# Patient Record
Sex: Female | Born: 1980 | Race: Black or African American | Hispanic: No | Marital: Married | Smoking: Never smoker
Health system: Southern US, Community
[De-identification: ages and names within clinical notes are randomized; demographics above are authoritative.]

## PROBLEM LIST (undated history)

## (undated) DIAGNOSIS — Z8759 Personal history of other complications of pregnancy, childbirth and the puerperium: Secondary | ICD-10-CM

## (undated) DIAGNOSIS — Z862 Personal history of diseases of the blood and blood-forming organs and certain disorders involving the immune mechanism: Secondary | ICD-10-CM

## (undated) DIAGNOSIS — Z9289 Personal history of other medical treatment: Secondary | ICD-10-CM

## (undated) DIAGNOSIS — I10 Essential (primary) hypertension: Secondary | ICD-10-CM

## (undated) HISTORY — DX: Personal history of other medical treatment: Z92.89

## (undated) HISTORY — DX: Essential (primary) hypertension: I10

## (undated) HISTORY — PX: HERNIA REPAIR: SHX51

## (undated) HISTORY — DX: Personal history of diseases of the blood and blood-forming organs and certain disorders involving the immune mechanism: Z86.2

## (undated) HISTORY — PX: HYSTEROSCOPY: SHX211

## (undated) HISTORY — PX: DILATION AND CURETTAGE OF UTERUS: SHX78

## (undated) HISTORY — DX: Personal history of other complications of pregnancy, childbirth and the puerperium: Z87.59

---

## 1999-03-15 HISTORY — PX: BIOPSY BREAST: PRO8

## 2003-03-15 DIAGNOSIS — Z9289 Personal history of other medical treatment: Secondary | ICD-10-CM

## 2003-03-15 HISTORY — DX: Personal history of other medical treatment: Z92.89

## 2009-04-07 ENCOUNTER — Ambulatory Visit: Payer: Self-pay | Admitting: Family Medicine

## 2009-04-07 DIAGNOSIS — E663 Overweight: Secondary | ICD-10-CM | POA: Insufficient documentation

## 2009-04-07 DIAGNOSIS — D6861 Antiphospholipid syndrome: Secondary | ICD-10-CM

## 2009-04-09 LAB — CONVERTED CEMR LAB
LDL Cholesterol: 69 mg/dL (ref 0–99)
Total CHOL/HDL Ratio: 3
Triglycerides: 46 mg/dL (ref 0.0–149.0)
VLDL: 9.2 mg/dL (ref 0.0–40.0)

## 2009-08-29 ENCOUNTER — Emergency Department (HOSPITAL_COMMUNITY): Admission: EM | Admit: 2009-08-29 | Discharge: 2009-08-29 | Payer: Self-pay | Admitting: Family Medicine

## 2009-08-29 ENCOUNTER — Encounter: Payer: Self-pay | Admitting: Family Medicine

## 2009-08-30 ENCOUNTER — Emergency Department (HOSPITAL_COMMUNITY): Admission: EM | Admit: 2009-08-30 | Discharge: 2009-08-30 | Payer: Self-pay | Admitting: Family Medicine

## 2009-08-31 ENCOUNTER — Telehealth: Payer: Self-pay | Admitting: Family Medicine

## 2009-11-23 ENCOUNTER — Telehealth: Payer: Self-pay | Admitting: Family Medicine

## 2010-04-13 NOTE — Progress Notes (Signed)
Summary: call a nurse  Phone Note Call from Patient   Summary of Call: Triage Record Num: [id number] Operator: Dayton Martes Patient Name: Jillian Fleming Call Date & Time: 08/29/2009 5:26:38PM Patient Phone: [phone] PCP: Audrie Gallus. Tower Patient Gender: Female PCP Fax : Patient DOB: 02/18/1981 Practice Name: [facility] Reason for Call: LMP now Pt sts that she was outside this am and she thinks she got bitten by something but doesn't know what it was. Pt has an area on her arm that is red, swollen and hot and  ~ 4" in diameter. Arm is throbbing and it is white around the puncture mark. Denies 911 sx's. Care adv given. Adv ER now. Protocol(s) Used: Bites and Stings - Insects / Spiders Recommended Outcome per Protocol: See ED Immediately Reason for Outcome: Suspected brown recluse spider bite AND signs of envenomation (burning pain with a white area) Care Advice:  ~ Another adult should drive.  ~ Be careful to avoid injury to the affected areas.  ~ DO NOT give medication to control the pain. Write down provider's name. List or place the following in a bag for transport with the patient: current prescription and/or OTC medications; alternative treatments, therapies and medications; and street drugs.  ~  ~ Do not break blisters, intact blisters are believed to reduce pain and decrease chance of secondary infection.  ~ Apply cloth-covered ice pack or a cool compress to the area while in transit to reduce pain and swelling.  ~ Do not apply anesthetic ointment or spray. Call EMS 911 if develop signs and symptoms of anaphylaxis: severe difficulty breathing; rapid, weak pulse; swelling of face, lips, tongue or throat causing throat tightness or difficulty swallowing; sudden onset of nausea, vomiting or diarrhea; loss of consciousness.  ~  ~ IMMEDIATE ACTION 08/29/2009 5:34:04PM Page 1 of 1 [id number] Initial call taken by: Melody Comas,  August 31, 2009 10:17 AM  Follow-up  for Phone Call        Noted. Follow-up by: Shaune Leeks MD,  August 31, 2009 1:56 PM

## 2010-04-13 NOTE — Letter (Signed)
Summary: [facility]  [facility]   Imported By: Lanelle Bal 09/18/2009 14:07:33  _____________________________________________________________________  External Attachment:    Type:   Image     Comment:   External Document

## 2010-04-13 NOTE — Assessment & Plan Note (Signed)
Summary: Jillian Fleming   Vital Signs:  Patient profile:   30 year old female Height:      64 inches Weight:      181 pounds BMI:     31.18 Temp:     97.8 degrees F oral Pulse rate:   64 / minute Pulse rhythm:   regular BP sitting:   100 / 70  (left arm) Cuff size:   regular  Vitals Entered By: Lowella Petties CMA (April 07, 2009 11:30 AM) CC: Jillian patient to establish care   History of Present Illness: here to est for care used to go to [facility] Dr Beckey Downing, and OB at [facility]   works at [employer]  is fairly healthy   has had hx of multiple miscarriage - then saw a specialist (Dr Leodis Liverpool)- had a work up  had abnormal clotting tests the second time  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  G7P2 never smoker  no alcohol  no extra regular exercise - is active with her kids   Past Medical History: iud hx of anti phospholipid ab -- is on asa (had to have lovenox for pregnancy)   GYN -- Dr Jamey Ripa at [employer]   Past Surgical History: blood transfusion in past - 2005 after miscarriage  surgery to remove scar tissue - hysteroscopy  D and C  breast bx in 2004  Family History: mother  HTN  father  no cancers  Social History: married  Loss adjuster, 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 Eyes:  Denies blurring and eye irritation. CV:  Denies chest pain or discomfort, lightheadness, palpitations, and shortness of breath with exertion. Resp:  Denies cough and wheezing. GI:  Denies abdominal pain, change in bowel habits, and indigestion. GU:  Denies abnormal vaginal bleeding, discharge, and dysuria. MS:  Denies joint pain. Derm:  Denies lesion(s), poor wound healing, and rash. Neuro:  Denies numbness and tingling. Psych:  Denies anxiety and depression; mood is generally very good . Endo:  Denies cold intolerance, excessive thirst, excessive urination, and heat intolerance. Heme:  Denies abnormal bruising and bleeding.  Physical Exam  General:  Well-developed,well-nourished,in no acute distress; alert,appropriate and cooperative throughout examination Head:  normocephalic, atraumatic, and no abnormalities observed.   Eyes:  vision grossly intact, pupils equal, pupils round, and pupils reactive to light.   Ears:  R ear normal  and L ear normal.  - scant cerumen Nose:  no nasal discharge.   Mouth:  pharynx pink and moist.   Neck:  supple with full rom and no masses or thyromegally, no JVD or carotid bruit  Chest Wall:  No deformities, masses, or tenderness noted. Lungs:  Normal respiratory effort, chest expands symmetrically. Lungs are clear to auscultation, no crackles or wheezes. Heart:  Normal rate and regular rhythm. S1 and S2 normal without gallop, murmur, click, rub or other extra sounds. Abdomen:  Bowel sounds positive,abdomen soft and non-tender without masses, organomegaly or hernias noted. Msk:  No deformity or scoliosis noted of thoracic or lumbar spine.  no acute joint changes  Pulses:   R and L carotid,radial,femoral,dorsalis pedis and posterior tibial pulses are full and equal bilaterally Extremities:  No clubbing, cyanosis, edema, or deformity noted with normal full range of motion of all joints.   Neurologic:  sensation intact to light touch, gait normal, and DTRs symmetrical and normal.   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 1 by mouth once daily  Patient Instructions: 1)  keep working on healthy diet and exercise  2)  labs today to screen for cholesterol  3)  flu shot today  Prior Medications: Current Allergies (reviewed today): ! * ESTROGEN BIRTH CONTROL   Preventive Care Screening  Last Tetanus Booster:    Date:  03/14/2008    Results:  Td

## 2010-04-13 NOTE — Progress Notes (Signed)
Summary: not feeling well  Phone Note Call from Patient   Caller: Patient Call For: Judith Part MD Summary of Call: Pt states she has had sweating and chills since last night, some diarrhea this morning but that is better.  No fever, no abd pain.  Some nausea this morning but that is better.  She has a bad headache.  Advised tylenol for headache,lots of rest and fluids.  Call back if fever or abd pain or other problems. Initial call taken by: Lowella Petties CMA,  November 23, 2009 12:49 PM  Follow-up for Phone Call        I agree- keep up with fluids and take tylenol for fever/ headache  if not improved tomorrow (or worse) make appt with first availible Follow-up by: Judith Part MD,  November 23, 2009 1:48 PM  Additional Follow-up for Phone Call Additional follow up Details #1::        Left message for patient to call back. Lewanda Rife LPN  November 23, 2009 2:42 PM   Advised pt. Additional Follow-up by: Lowella Petties CMA,  November 23, 2009 3:11 PM

## 2010-04-13 NOTE — Letter (Signed)
Summary: Patient Questionnaire  Patient Questionnaire   Imported By: Beau Fanny 04/07/2009 14:35:40  _____________________________________________________________________  External Attachment:    Type:   Image     Comment:   External Document

## 2011-08-09 ENCOUNTER — Ambulatory Visit (INDEPENDENT_AMBULATORY_CARE_PROVIDER_SITE_OTHER): Payer: BC Managed Care – PPO | Admitting: Family Medicine

## 2011-08-09 ENCOUNTER — Encounter: Payer: Self-pay | Admitting: Family Medicine

## 2011-08-09 VITALS — BP 122/80 | HR 76 | Temp 98.1°F | Ht 65.0 in | Wt 158.0 lb

## 2011-08-09 DIAGNOSIS — K529 Noninfective gastroenteritis and colitis, unspecified: Secondary | ICD-10-CM | POA: Insufficient documentation

## 2011-08-09 DIAGNOSIS — K5289 Other specified noninfective gastroenteritis and colitis: Secondary | ICD-10-CM

## 2011-08-09 DIAGNOSIS — R1110 Vomiting, unspecified: Secondary | ICD-10-CM

## 2011-08-09 NOTE — Assessment & Plan Note (Signed)
Suspect viral  Neg preg test  Several fam members had symptoms for just a day  Lab - in light of 1 week of symptoms  Disc BRAT diet and fluids- to advance gradually  Update if not starting to improve in a week or if worsening

## 2011-08-09 NOTE — Progress Notes (Signed)
Subjective:    Patient ID: Jillian Fleming, female    DOB: 1980-03-25, 31 y.o.   MRN: [id number]  HPI Is here for diarrhea  Queasy since last Tuesday  Diarrhea started on Friday  Vomited Sunday and tues am  Stomach is not hurting - just not comfortable -- is growling and gurgling  Yesterday tried to eat a bit of grilled chicken / pasta salad   No urgency of stools About 3 bm per day  No blood in stool Loose and dark green   Before this - she was eating a lot of bagel thins - they tended to make her a bit nauseated  No hx of celiac or wheat sensitivity and no lactose intolerance   This Sunday one child vomited then felt fine  Other child had diarrhea one day   Has had an intentional wt loss - since jan , lost over 20 lb in 4 mo 1200 cal per day, and exercises too  Doing great with that    Has not had a fever    Has been taking pepto - and that did help some   Patient Active Problem List  Diagnoses  . OVERWEIGHT  . COAGULOPATHY   No past medical history on file. No past surgical history on file. History  Substance Use Topics  . Smoking status: Never Smoker   . Smokeless tobacco: Not on file  . Alcohol Use: Not on file   No family history on file. No Known Allergies No current outpatient prescriptions on file prior to visit.     Review of Systems Review of Systems  Constitutional: Negative for fever, appetite change, fatigue and unexpected weight change.  Eyes: Negative for pain and visual disturbance.  Respiratory: Negative for cough and shortness of breath.   Cardiovascular: Negative for cp or palpitations    Gastrointestinal: pos for nausea and diarrhea/ few episodes of vomiting , no blood in stool or abd pain  Genitourinary: Negative for urgency and frequency.  Skin: Negative for pallor or rash   Neurological: Negative for weakness, light-headedness, numbness and headaches.  Hematological: Negative for adenopathy. Does not bruise/bleed easily.    Psychiatric/Behavioral: Negative for dysphoric mood. The patient is not nervous/anxious.         Objective:   Physical Exam  Constitutional: She appears well-developed and well-nourished. No distress.  HENT:  Head: Normocephalic and atraumatic.  Mouth/Throat: Oropharynx is clear and moist.  Eyes: Conjunctivae and EOM are normal. Pupils are equal, round, and reactive to light. No scleral icterus.  Neck: Normal range of motion. Neck supple. No JVD present.  Cardiovascular: Normal rate, regular rhythm, normal heart sounds and intact distal pulses.  Exam reveals no gallop.   Pulmonary/Chest: Effort normal and breath sounds normal. No respiratory distress. She has no wheezes.  Abdominal: Soft. Bowel sounds are normal. She exhibits no distension and no mass. There is no tenderness.       bs are slt hyperactive , not high pitched  Musculoskeletal: Normal range of motion. She exhibits no edema and no tenderness.  Lymphadenopathy:    She has no cervical adenopathy.  Neurological: She is alert. She has normal reflexes. No cranial nerve deficit. She exhibits normal muscle tone. Coordination normal.  Skin: Skin is warm and dry. No rash noted. No erythema. No pallor.       Brisk capillary refil  Psychiatric: She has a normal mood and affect.          Assessment & Plan:

## 2011-08-09 NOTE — Patient Instructions (Signed)
Sip on fluids (stir bubbles out first) and start to eat small amounts with BRAT diet Banana/ rice/ apple sauce and toast  Then - as you get better- can advance slowly Alert me if abdominal pain/ blood in stool/ fever or generally getting worse  Labs today  preg test today is negative

## 2011-08-10 ENCOUNTER — Telehealth: Payer: Self-pay | Admitting: Family Medicine

## 2011-08-10 LAB — COMPREHENSIVE METABOLIC PANEL
ALT: 11 U/L (ref 0–35)
AST: 16 U/L (ref 0–37)
Albumin: 4.6 g/dL (ref 3.5–5.2)
Alkaline Phosphatase: 40 U/L (ref 39–117)
BUN: 10 mg/dL (ref 6–23)
Calcium: 9.5 mg/dL (ref 8.4–10.5)
Chloride: 108 mEq/L (ref 96–112)
Potassium: 3.5 mEq/L (ref 3.5–5.1)
Sodium: 142 mEq/L (ref 135–145)
Total Protein: 7.6 g/dL (ref 6.0–8.3)

## 2011-08-10 LAB — CBC WITH DIFFERENTIAL/PLATELET
Basophils Relative: 0.4 % (ref 0.0–3.0)
Eosinophils Absolute: 0.1 10*3/uL (ref 0.0–0.7)
Lymphocytes Relative: 17.9 % (ref 12.0–46.0)
MCHC: 32.8 g/dL (ref 30.0–36.0)
Monocytes Absolute: 0.4 10*3/uL (ref 0.1–1.0)
Neutrophils Relative %: 74.8 % (ref 43.0–77.0)
Platelets: 228 10*3/uL (ref 150.0–400.0)
RBC: 4.51 Mil/uL (ref 3.87–5.11)
WBC: 7.4 10*3/uL (ref 4.5–10.5)

## 2011-08-10 NOTE — Telephone Encounter (Signed)
Jillian Fleming is calling for her lab results.

## 2011-08-10 NOTE — Telephone Encounter (Signed)
Lab results are back.  Will route back to Dr. Milinda Antis for comments.

## 2011-08-10 NOTE — Telephone Encounter (Signed)
They are not back yet, suprisingly...please check on status

## 2011-08-10 NOTE — Telephone Encounter (Signed)
Labs are ok -normal - let me know if she is not starting to feel better

## 2011-08-11 ENCOUNTER — Telehealth: Payer: Self-pay | Admitting: Family Medicine

## 2011-08-11 MED ORDER — PROMETHAZINE HCL 25 MG PO TABS: 25.0000 mg | ORAL_TABLET | Freq: Three times a day (TID) | ORAL | Status: DC | PRN

## 2011-08-11 NOTE — Telephone Encounter (Signed)
Calling for the status on her Lab Results.

## 2011-08-11 NOTE — Telephone Encounter (Signed)
Given her exam and labs - and now other family members with it , I suspect this is viral  If abdominal pain or s/s dehydration or worse or not improved in next 2-3 d let me know Otherwise keep sipping fluids to prevent dehydration and stick to BRAT diet  If she would like me to call in phenergan for nausea let me know and tell me what pharmacy  If no further imp next week we will do some stool tests

## 2011-08-11 NOTE — Telephone Encounter (Signed)
Left message on cell phone voicemail advising patient as instructed, advised that she call back if she wants Rx for Phenergan.

## 2011-08-11 NOTE — Telephone Encounter (Signed)
Patient advised that Rx was sent to pharmacy.

## 2011-08-11 NOTE — Telephone Encounter (Signed)
Phenergan sent electronically

## 2011-08-11 NOTE — Telephone Encounter (Signed)
Patient advised as instructed via telephone, she stated that in the morning she feels bad and vomits just like she did when she was pregnant.  As the day goes on she feels fine.  She also stated that her daughter came home from school yesterday stating that she had diarrhea four times yesterday.  Please advise.

## 2011-08-11 NOTE — Telephone Encounter (Signed)
Addended by: Roxy Manns A on: 08/11/2011 02:46 PM   Modules accepted: Orders

## 2011-08-11 NOTE — Telephone Encounter (Signed)
Pt request Phenergan sent to [facility].

## 2011-09-05 ENCOUNTER — Telehealth: Payer: Self-pay | Admitting: Family Medicine

## 2011-09-05 NOTE — Telephone Encounter (Signed)
Caller: Jillian Fleming/Patient; PCP: Jillian Manns A.; CB#: [phone];  Call regarding > BP with headache on 08/31/11. GYN advised to follow up with PCP for BP 142/"90 something" before GYN exam.  LMP 08/12/11. Mirena IUD.  Advised to see MD within 72  hrs for multiple elevated blood pressure readings without other sympoms and no previous work up per HTN Diagnosed or Suspected Guideline.  Appt. scheduled for 09/06/11 at 1045 with Dr Dayton Martes.

## 2011-09-06 ENCOUNTER — Ambulatory Visit (INDEPENDENT_AMBULATORY_CARE_PROVIDER_SITE_OTHER): Payer: BC Managed Care – PPO | Admitting: Family Medicine

## 2011-09-06 ENCOUNTER — Encounter: Payer: Self-pay | Admitting: Family Medicine

## 2011-09-06 VITALS — BP 130/88 | HR 64 | Temp 98.5°F | Wt 154.0 lb

## 2011-09-06 DIAGNOSIS — R030 Elevated blood-pressure reading, without diagnosis of hypertension: Secondary | ICD-10-CM

## 2011-09-06 DIAGNOSIS — IMO0001 Reserved for inherently not codable concepts without codable children: Secondary | ICD-10-CM

## 2011-09-06 NOTE — Progress Notes (Signed)
  Subjective:    Patient ID: Jillian Fleming Height, female    DOB: 12/14/1980, 31 y.o.   MRN: [id number]  HPI  31 yo pt of Dr. Milinda Fleming without prior h/o HTN here for ? HTN.  Went to OBGYN at [facility] on 6/19 and was told her BP was elevated to 142/92. Had rushed to get to her appointment due to limited parking at [facility] that day. No CP or SOB. No HA or blurred vision.  Does have family h/o HTN.  Patient Active Problem List  Diagnosis  . OVERWEIGHT  . COAGULOPATHY  . Gastroenteritis   No past medical history on file. No past surgical history on file. History  Substance Use Topics  . Smoking status: Never Smoker   . Smokeless tobacco: Not on file  . Alcohol Use: Not on file   No family history on file. No Known Allergies No current outpatient prescriptions on file prior to visit.   Marland Kitchenreivewed    Review of Systems    See HPI Objective:   Physical Exam ;BP 130/88  Pulse 64  Temp 98.5 F (36.9 C)  Wt 154 lb (69.854 kg)\ BP Readings from Last 3 Encounters:  09/06/11 130/88  08/09/11 122/80  04/07/09 100/70    General:  Well-developed,well-nourished,in no acute distress; alert,appropriate and cooperative throughout examination Head:  normocephalic and atraumatic.   Lungs:  Normal respiratory effort, chest expands symmetrically. Lungs are clear to auscultation, no crackles or wheezes. Heart:  Normal rate and regular rhythm. S1 and S2 normal without gallop, murmur, click, rub or other extra sounds. Extremities:  No clubbing, cyanosis, edema, or deformity noted with normal full range of motion of all joints.   Neurologic:  alert & oriented X3 and gait normal.   Skin:  Intact without suspicious lesions or rashes Psych:  Cognition and judgment appear intact. Alert and cooperative with normal attention span and concentration. No apparent delusions, illusions, hallucinations      Assessment & Plan:  1.  Elevated BP- Normotensive here today and was at prior visit here as  well. Reassurance provided.  Likely elevated due to rushing to get to office visit. Follow up with Dr. Milinda Fleming prn. The patient indicates understanding of these issues and agrees with the plan.

## 2011-09-06 NOTE — Patient Instructions (Addendum)
It was nice to meet you. Your blood pressure was excellent. Follow up with Dr. Milinda Antis as needed.

## 2011-09-07 NOTE — Telephone Encounter (Signed)
Pt evaluated.  

## 2012-01-03 ENCOUNTER — Ambulatory Visit: Payer: Self-pay | Admitting: Internal Medicine

## 2012-02-28 ENCOUNTER — Encounter: Payer: Self-pay | Admitting: *Deleted

## 2012-02-29 ENCOUNTER — Ambulatory Visit (INDEPENDENT_AMBULATORY_CARE_PROVIDER_SITE_OTHER): Payer: BC Managed Care – PPO | Admitting: Family Medicine

## 2012-02-29 ENCOUNTER — Encounter: Payer: Self-pay | Admitting: Family Medicine

## 2012-02-29 VITALS — BP 138/68 | HR 114 | Temp 100.7°F | Ht 65.0 in | Wt 161.8 lb

## 2012-02-29 DIAGNOSIS — R509 Fever, unspecified: Secondary | ICD-10-CM

## 2012-02-29 DIAGNOSIS — J111 Influenza due to unidentified influenza virus with other respiratory manifestations: Secondary | ICD-10-CM

## 2012-02-29 DIAGNOSIS — J029 Acute pharyngitis, unspecified: Secondary | ICD-10-CM

## 2012-02-29 DIAGNOSIS — R05 Cough: Secondary | ICD-10-CM

## 2012-02-29 DIAGNOSIS — R52 Pain, unspecified: Secondary | ICD-10-CM

## 2012-02-29 LAB — POCT INFLUENZA A/B: Influenza A, POC: NEGATIVE

## 2012-02-29 MED ORDER — GUAIFENESIN-CODEINE 100-10 MG/5ML PO SYRP: 5.0000 mL | ORAL_SOLUTION | Freq: Four times a day (QID) | ORAL | Status: DC | PRN

## 2012-02-29 MED ORDER — OSELTAMIVIR PHOSPHATE 75 MG PO CAPS: 75.0000 mg | ORAL_CAPSULE | Freq: Two times a day (BID) | ORAL | Status: DC

## 2012-02-29 NOTE — Patient Instructions (Addendum)
I think you have the flu  Try to stay away from family members or wear a mask if needed Fluids/rest  Tylenol or ibuprofen - tylenol every 4 hours/ ibuprofen every 6 - can be given for fever and aches - alternate them  mucinex DM is good for cough - and if this is not helpful use the cough syrup I wrote for with codeine - this can sedate  Take tamiflu as directed  Update if not starting to improve in a week or if worsening

## 2012-02-29 NOTE — Assessment & Plan Note (Signed)
Clinical dx - though her rapid flu test was neg tamiflu bid 5 d Disc symptomatic care - see instructions on AVS  Given robitussin with codeine for cough Handout given Disc fever control S/s of meningitis discussed as well to watch for Update if not starting to improve in a week or if worsening

## 2012-02-29 NOTE — Progress Notes (Signed)
  Subjective:    Patient ID: Jillian Fleming, female    DOB: May 03, 1980, 31 y.o.   MRN: [id number]  HPI Here with uri symptoms Hit her like a ton of bricks yesterday - headache/ sore throat/ coughing really bad This am -- coughed so hard she vomited  Really achey all over   Taking tylenol cold and flu -- last dose 2:30 this am   Cough was dry and now a little prod yellow stuff  Throat is raw  Runny and stuffy nose  No wheeze or sob  Daughter has uri - but not that bad   Patient Active Problem List  Diagnosis  . OVERWEIGHT  . COAGULOPATHY  . Gastroenteritis   Past Medical History  Diagnosis Date  . History of antiphospholipid syndrome     is on asa and has to have lovenox for pregnancy  . History of miscarriage   . History of blood transfusion 2005    after miscarriage   Past Surgical History  Procedure Date  . Hysteroscopy     surgery to remove scar tissue  . Dilation and curettage of uterus   . Biopsy breast 2001   History  Substance Use Topics  . Smoking status: Never Smoker   . Smokeless tobacco: Not on file  . Alcohol Use: No   Family History  Problem Relation Age of Onset  . Hypertension Mother    No Known Allergies No current outpatient prescriptions on file prior to visit.      Review of Systems Review of Systems  Constitutional: Negative for  unexpected weight change. pos for fever and malaise  Eyes: Negative for pain and visual disturbance.  ENT pos for cong and rhinorrhea and raw throat, neg for sinus pain  Respiratory: Negative for cough and shortness of breath.   Cardiovascular: Negative for cp or palpitations    Gastrointestinal: Negative for nausea, diarrhea and constipation.  Genitourinary: Negative for urgency and frequency.  Skin: Negative for pallor or rash   Neurological: Negative for weakness, light-headedness, numbness and headaches.  Hematological: Negative for adenopathy. Does not bruise/bleed easily.  Psychiatric/Behavioral:  Negative for dysphoric mood. The patient is not nervous/anxious.         Objective:   Physical Exam  Constitutional: She appears well-developed and well-nourished.       Ill appearing but in good spirits  HENT:  Head: Normocephalic and atraumatic.  Right Ear: External ear normal.  Left Ear: External ear normal.  Mouth/Throat: Oropharynx is clear and moist. No oropharyngeal exudate.       Nares are injected and congested   No sinus tenderness  Eyes: Conjunctivae normal and EOM are normal. Pupils are equal, round, and reactive to light. Right eye exhibits no discharge. Left eye exhibits no discharge.  Neck: Normal range of motion. Neck supple.  Cardiovascular: Normal rate, regular rhythm, normal heart sounds and intact distal pulses.  Exam reveals no gallop.   Pulmonary/Chest: Effort normal and breath sounds normal. No respiratory distress. She has no wheezes. She has no rales.  Abdominal: Soft. Bowel sounds are normal. She exhibits no distension. There is no tenderness.  Lymphadenopathy:    She has no cervical adenopathy.  Neurological: She is alert.  Skin: Skin is warm and dry. No rash noted.  Psychiatric: She has a normal mood and affect.          Assessment & Plan:

## 2013-02-19 ENCOUNTER — Telehealth: Payer: Self-pay | Admitting: Family Medicine

## 2013-02-19 DIAGNOSIS — Z0000 Encounter for general adult medical examination without abnormal findings: Secondary | ICD-10-CM | POA: Insufficient documentation

## 2013-02-19 NOTE — Telephone Encounter (Signed)
Message copied by Judy Pimple on Tue Feb 19, 2013  8:06 AM ------      Message from: Alvina Chou      Created: Fri Feb 08, 2013  2:12 PM      Regarding: Lab orders for Wend[place], 12.10.14       Patient is scheduled for CPX labs, please order future labs, Thanks , Terri       ------

## 2013-02-20 ENCOUNTER — Other Ambulatory Visit: Payer: BC Managed Care – PPO

## 2013-02-25 ENCOUNTER — Emergency Department: Payer: Self-pay | Admitting: Emergency Medicine

## 2013-02-25 ENCOUNTER — Telehealth: Payer: Self-pay | Admitting: Family Medicine

## 2013-02-25 NOTE — Telephone Encounter (Signed)
Patient Information:  Caller Name: Keayra  Phone: [phone]  Patient: Jillian Fleming, Jillian Fleming  Gender: Female  DOB: 12-04-1980  Age: 32 Years  PCP: [facility])  Pregnant: No  Office Follow Up:  Does the office need to follow up with this patient?: No  Instructions For The Office: N/A   Symptoms  Reason For Call & Symptoms: Eyes swelling, ears swelling, hives on torso.  Onset 13:00.  She reports she ate at an [place] prior to the symptoms that started after 2 sips of her drink .  She had throat tightness and hoarseness and cough within minutes.  Currently she does not have difficulty breathing.  Other emergent symptoms ruled out.  Go to ED Now  per Hives protocol due to Patient sounds very sick or weak to the triager.  Reviewed Health History In EMR: Yes  Reviewed Medications In EMR: Yes  Reviewed Allergies In EMR: Yes  Reviewed Surgeries / Procedures: Yes  Date of Onset of Symptoms: 02/25/2013  Treatments Tried: Claritin - symptoms improved.  Treatments Tried Worked: Yes OB / GYN:  LMP: 02/18/2013  Guideline(s) Used:  Hives  Disposition Per Guideline:   Go to ED Now  Reason For Disposition Reached:   Widespread hives and onset < 2 hours of exposure to high-risk allergen (e.g., peanuts, tree nuts, fish, or shellfish)  Advice Given:  Prevention  : In the future, avoid any food you think caused the hives.  Prevention  : In the future, avoid any food you think caused the hives.  Call Back If:  You become worse.  RN Overrode Recommendation:  Document Patient  Advised ED due to sudden onset of symptoms almost immediately after exposure to food/ drink; she did improve after Claritin.

## 2013-02-25 NOTE — Telephone Encounter (Signed)
Noted. Agree. plz call tomorrow for an update. 

## 2013-02-27 ENCOUNTER — Ambulatory Visit (INDEPENDENT_AMBULATORY_CARE_PROVIDER_SITE_OTHER): Payer: BC Managed Care – PPO | Admitting: Family Medicine

## 2013-02-27 ENCOUNTER — Encounter: Payer: Self-pay | Admitting: Family Medicine

## 2013-02-27 VITALS — BP 118/72 | HR 72 | Temp 98.3°F | Ht 63.75 in | Wt 175.0 lb

## 2013-02-27 DIAGNOSIS — Z0000 Encounter for general adult medical examination without abnormal findings: Secondary | ICD-10-CM

## 2013-02-27 DIAGNOSIS — T788XXS Other adverse effects, not elsewhere classified, sequela: Secondary | ICD-10-CM

## 2013-02-27 DIAGNOSIS — Z91018 Allergy to other foods: Secondary | ICD-10-CM | POA: Insufficient documentation

## 2013-02-27 LAB — LIPID PANEL
Cholesterol: 112 mg/dL (ref 0–200)
LDL Cholesterol: 66 mg/dL (ref 0–99)
Triglycerides: 37 mg/dL (ref 0.0–149.0)
VLDL: 7.4 mg/dL (ref 0.0–40.0)

## 2013-02-27 LAB — CBC WITH DIFFERENTIAL/PLATELET
Basophils Absolute: 0 10*3/uL (ref 0.0–0.1)
Basophils Relative: 0.6 % (ref 0.0–3.0)
Eosinophils Relative: 5.5 % — ABNORMAL HIGH (ref 0.0–5.0)
HCT: 39.5 % (ref 36.0–46.0)
Lymphs Abs: 1.5 10*3/uL (ref 0.7–4.0)
MCV: 88.3 fl (ref 78.0–100.0)
Monocytes Absolute: 0.4 10*3/uL (ref 0.1–1.0)
Monocytes Relative: 7.1 % (ref 3.0–12.0)
Neutrophils Relative %: 58.4 % (ref 43.0–77.0)
Platelets: 258 10*3/uL (ref 150.0–400.0)
RBC: 4.48 Mil/uL (ref 3.87–5.11)
RDW: 12.2 % (ref 11.5–14.6)
WBC: 5.4 10*3/uL (ref 4.5–10.5)

## 2013-02-27 LAB — COMPREHENSIVE METABOLIC PANEL
AST: 15 U/L (ref 0–37)
Albumin: 4.6 g/dL (ref 3.5–5.2)
CO2: 26 mEq/L (ref 19–32)
GFR: 98.72 mL/min (ref >60.00)
Glucose, Bld: 84 mg/dL (ref 70–99)
Potassium: 3.8 mEq/L (ref 3.5–5.1)
Sodium: 140 mEq/L (ref 135–145)
Total Bilirubin: 0.7 mg/dL (ref 0.3–1.2)
Total Protein: 7.5 g/dL (ref 6.0–8.3)

## 2013-02-27 MED ORDER — EPINEPHRINE 0.3 MG/0.3ML IJ SOAJ: 0.3000 mg | Freq: Once | INTRAMUSCULAR | Status: DC

## 2013-02-27 NOTE — Telephone Encounter (Signed)
Per Marcelline Mates: LM on machine requesting cb.

## 2013-02-27 NOTE — Patient Instructions (Signed)
Hold off on flu vaccine until after allergy referral - talk to the allergist about it  We will do allergy referral at check out  Labs today  Keep up healthy diet and exercise

## 2013-02-27 NOTE — Telephone Encounter (Signed)
LM on pts machine requesting a call back

## 2013-02-27 NOTE — Progress Notes (Signed)
Subjective:    Patient ID: Jillian Fleming, female    DOB: [birth year], 32 y.o.   MRN: [id number]  HPI Here for health maintenance exam and to review chronic medical problems    Doing well overall   She had uri 2 wk ago- and had to take abx / [facility] - laryngitis (augmentin)   Needs an allergist referral  She went out for her work holiday lunch - ate chicken parm and drank a Medical sales representative- then she had allergic rxn- swollen face/ eyelids and lips and ltching with hoarseness (itchy throat but not sob), with some palpitations and hives  She took a claritin-which helped - and getting out in the air helped  She went to ER  - at [facility] - all was ok and told to ref to allergist   Wt is up 14 lb with bmi of 30  Pap/ gyn-last exam was end of sept 2014   Wants to try for another baby   Flu vaccine - has not had a vaccine yet -wants to get one today (will hold off on that until allergy referal) Never had one before   Td 1/10  Family hx -no cancer / no heart dz or other problems   Patient Active Problem List   Diagnosis Date Noted  . Routine general medical examination at a health care facility 02/19/2013  . Influenza 02/29/2012  . Gastroenteritis 08/09/2011  . OVERWEIGHT 04/07/2009  . COAGULOPATHY 04/07/2009   Past Medical History  Diagnosis Date  . History of antiphospholipid syndrome     is on asa and has to have lovenox for pregnancy  . History of miscarriage   . History of blood transfusion 2005    after miscarriage   Past Surgical History  Procedure Laterality Date  . Hysteroscopy      surgery to remove scar tissue  . Dilation and curettage of uterus    . Biopsy breast  2001   History  Substance Use Topics  . Smoking status: Never Smoker   . Smokeless tobacco: Not on file  . Alcohol Use: No   Family History  Problem Relation Age of Onset  . Hypertension Mother    No Known Allergies No current outpatient prescriptions on file prior to visit.   No current  facility-administered medications on file prior to visit.      Review of Systems     Objective:   Physical Exam  Constitutional: She appears well-developed and well-nourished. No distress.  HENT:  Head: Normocephalic and atraumatic.  Right Ear: External ear normal.  Left Ear: External ear normal.  Nose: Nose normal.  Mouth/Throat: Oropharynx is clear and moist.  Nares are boggy   Eyes: Conjunctivae and EOM are normal. Pupils are equal, round, and reactive to light. Right eye exhibits no discharge. Left eye exhibits no discharge. No scleral icterus.  Neck: Normal range of motion. Neck supple. No JVD present. Carotid bruit is not present. No thyromegaly present.  Cardiovascular: Normal rate, regular rhythm, normal heart sounds and intact distal pulses.  Exam reveals no gallop.   Pulmonary/Chest: Effort normal and breath sounds normal. No respiratory distress. She has no wheezes. She has no rales.  Abdominal: Soft. Bowel sounds are normal. She exhibits no distension and no mass. There is no tenderness.  Musculoskeletal: She exhibits no edema and no tenderness.  Lymphadenopathy:    She has no cervical adenopathy.  Neurological: She is alert. She has normal reflexes. No cranial nerve deficit. She exhibits normal  muscle tone. Coordination normal.  Skin: Skin is warm and dry. No rash noted. No erythema. No pallor.  No rash or urticaria   Psychiatric: She has a normal mood and affect.          Assessment & Plan:

## 2013-02-27 NOTE — Progress Notes (Signed)
Pre-visit discussion using our clinic review tool. No additional management support is needed unless otherwise documented below in the visit note.  

## 2013-02-28 NOTE — Assessment & Plan Note (Addendum)
Reviewed health habits including diet and exercise and skin cancer prevention Reviewed appropriate screening tests for age  Also reviewed health mt list, fam hx and immunization status , as well as social and family history   See HPI Labs today   

## 2013-02-28 NOTE — Assessment & Plan Note (Signed)
Episode of angioedema after eating  Referred to allergist for eval  For now will avoid the flu vaccine until further word from the allergist - due to rxn happening after eating chicken  Px for epi pen and inst in use

## 2013-03-01 ENCOUNTER — Encounter: Payer: Self-pay | Admitting: *Deleted

## 2013-03-01 NOTE — Telephone Encounter (Signed)
Per chart-patient went to [facility] on 02/25/13 and came in for CPE with Dr. Milinda Antis on 02/27/13.

## 2013-04-01 ENCOUNTER — Emergency Department: Payer: Self-pay | Admitting: Emergency Medicine

## 2013-04-04 ENCOUNTER — Telehealth: Payer: Self-pay | Admitting: Family Medicine

## 2013-04-04 ENCOUNTER — Ambulatory Visit (INDEPENDENT_AMBULATORY_CARE_PROVIDER_SITE_OTHER): Payer: BC Managed Care – PPO | Admitting: Internal Medicine

## 2013-04-04 ENCOUNTER — Encounter: Payer: Self-pay | Admitting: Internal Medicine

## 2013-04-04 VITALS — BP 120/90 | HR 73 | Temp 98.2°F | Wt 176.2 lb

## 2013-04-04 DIAGNOSIS — S7490XA Injury of unspecified nerve at hip and thigh level, unspecified leg, initial encounter: Secondary | ICD-10-CM

## 2013-04-04 DIAGNOSIS — S8490XA Injury of unspecified nerve at lower leg level, unspecified leg, initial encounter: Secondary | ICD-10-CM

## 2013-04-04 NOTE — Progress Notes (Signed)
Pre-visit discussion using our clinic review tool. No additional management support is needed unless otherwise documented below in the visit note.  

## 2013-04-04 NOTE — Progress Notes (Signed)
Subjective:    Patient ID: Jillian Fleming, female    DOB: 25-Mar-1980, 33 y.o.   MRN: [id number]  HPI  Pt presents to the clinic today with c/o right thigh numbness. She reports she noticed this yesterday. She did give herself an epi pen injection on 1/19 and was taken to the ER for an anaphylactic reaction to a food allergy. She was started on prednisone at that time. The numbness seem to be constant. She does feel like it has gotten better. She denies weakness in that leg.  Review of Systems      Past Medical History  Diagnosis Date  . History of antiphospholipid syndrome     is on asa and has to have lovenox for pregnancy  . History of miscarriage   . History of blood transfusion 2005    after miscarriage    Current Outpatient Prescriptions  Medication Sig Dispense Refill  . EPINEPHrine (EPI-PEN) 0.3 mg/0.3 mL SOAJ injection Inject 0.3 mLs (0.3 mg total) into the muscle once. As needed for allergic reaction  1 Device  1  . predniSONE (STERAPRED UNI-PAK) 5 MG TABS tablet Take 5 mg by mouth every morning.       No current facility-administered medications for this visit.    No Known Allergies  Family History  Problem Relation Age of Onset  . Hypertension Mother     History   Social History  . Marital Status: Married    Spouse Name: N/A    Number of Children: N/A  . Years of Education: N/A   Occupational History  . Not on file.   Social History Main Topics  . Smoking status: Never Smoker   . Smokeless tobacco: Not on file  . Alcohol Use: No  . Drug Use: No  . Sexual Activity: Not on file   Other Topics Concern  . Not on file   Social History Narrative  . No narrative on file     Constitutional: Denies fever, malaise, fatigue, headache or abrupt weight changes.  Musculoskeletal: Denies decrease in range of motion, difficulty with gait, muscle pain or joint pain and swelling.  Skin: Denies redness, rashes, lesions or ulcercations.  Neurological:  Denies dizziness, difficulty with memory, difficulty with speech or problems with balance and coordination.   No other specific complaints in a complete review of systems (except as listed in HPI above).  Objective:   Physical Exam  BP 120/90  Pulse 73  Temp(Src) 98.2 F (36.8 C) (Oral)  Wt 176 lb 4 oz (79.946 kg)  SpO2 98% Wt Readings from Last 3 Encounters:  04/04/13 176 lb 4 oz (79.946 kg)  02/27/13 175 lb (79.379 kg)  02/29/12 161 lb 12 oz (73.369 kg)    General: Appears her stated age, well developed, well nourished in NAD. Skin: Warm, dry and intact. No rashes, lesions or ulcerations noted. Small amount of ecchymosis noted at the site of injection. Cardiovascular: Normal rate and rhythm. S1,S2 noted.  No murmur, rubs or gallops noted. No JVD or BLE edema. No carotid bruits noted. Pulmonary/Chest: Normal effort and positive vesicular breath sounds. No respiratory distress. No wheezes, rales or ronchi noted.  Musculoskeletal: Normal range of motion. Strength 5/5 BLE. No difficulty with gait.  Neurological: Alert and oriented. Sensation decreased in the mid lateral thigh to pin prick. Coordination normal. +DTRs bilaterally.   BMET    Component Value Date/Time   NA 140 02/27/2013 1110   K 3.8 02/27/2013 1110   CL 107  02/27/2013 1110   CO2 26 02/27/2013 1110   GLUCOSE 84 02/27/2013 1110   BUN 13 02/27/2013 1110   CREATININE 0.9 02/27/2013 1110   CALCIUM 9.1 02/27/2013 1110    Lipid Panel     Component Value Date/Time   CHOL 112 02/27/2013 1110   TRIG 37.0 02/27/2013 1110   HDL 39.10 02/27/2013 1110   CHOLHDL 3 02/27/2013 1110   VLDL 7.4 02/27/2013 1110   LDLCALC 66 02/27/2013 1110    CBC    Component Value Date/Time   WBC 5.4 02/27/2013 1110   RBC 4.48 02/27/2013 1110   HGB 13.3 02/27/2013 1110   HCT 39.5 02/27/2013 1110   PLT 258.0 02/27/2013 1110   MCV 88.3 02/27/2013 1110   MCHC 33.6 02/27/2013 1110   RDW 12.2 02/27/2013 1110   LYMPHSABS 1.5  02/27/2013 1110   MONOABS 0.4 02/27/2013 1110   EOSABS 0.3 02/27/2013 1110   BASOSABS 0.0 02/27/2013 1110    Hgb A1C No results found for this basename: HGBA1C         Assessment & Plan:   Nerve Injury secondary to injection:  Reassurance given that this should resolve with time I would continue the prednisone until it is finished Try to avoid touching anything hot or sharp as decreased sensation can lead to injury  RTC as needed or if the symptoms persist for > 2 weeks or worsen

## 2013-04-04 NOTE — Patient Instructions (Signed)
Paresthesia °Paresthesia is an abnormal burning or prickling sensation. This sensation is generally felt in the hands, arms, legs, or feet. However, it may occur in any part of the body. It is usually not painful. The feeling may be described as: °· Tingling or numbness. °· "Pins and needles." °· Skin crawling. °· Buzzing. °· Limbs "falling asleep." °· Itching. °Most people experience temporary (transient) paresthesia at some time in their lives. °CAUSES  °Paresthesia may occur when you breathe too quickly (hyperventilation). It can also occur without any apparent cause. Commonly, paresthesia occurs when pressure is placed on a nerve. The feeling quickly goes away once the pressure is removed. For some people, however, paresthesia is a long-lasting (chronic) condition caused by an underlying disorder. The underlying disorder may be: °· A traumatic, direct injury to nerves. Examples include a: °· Broken (fractured) neck. °· Fractured skull. °· A disorder affecting the brain and spinal cord (central nervous system). Examples include: °· Transverse myelitis. °· Encephalitis. °· Transient ischemic attack. °· Multiple sclerosis. °· Stroke. °· Tumor or blood vessel problems, such as an arteriovenous malformation pressing against the brain or spinal cord. °· A condition that damages the peripheral nerves (peripheral neuropathy). Peripheral nerves are not part of the brain and spinal cord. These conditions include: °· Diabetes. °· Peripheral vascular disease. °· Nerve entrapment syndromes, such as carpal tunnel syndrome. °· Shingles. °· Hypothyroidism. °· Vitamin B12 deficiencies. °· Alcoholism. °· Heavy metal poisoning (lead, arsenic). °· Rheumatoid arthritis. °· Systemic lupus erythematosus. °DIAGNOSIS  °Your caregiver will attempt to find the underlying cause of your paresthesia. Your caregiver may: °· Take your medical history. °· Perform a physical exam. °· Order various lab tests. °· Order imaging tests. °TREATMENT    °Treatment for paresthesia depends on the underlying cause. °HOME CARE INSTRUCTIONS °· Avoid drinking alcohol. °· You may consider massage or acupuncture to help relieve your symptoms. °· Keep all follow-up appointments as directed by your caregiver. °SEEK IMMEDIATE MEDICAL CARE IF:  °· You feel weak. °· You have trouble walking or moving. °· You have problems with speech or vision. °· You feel confused. °· You cannot control your bladder or bowel movements. °· You feel numbness after an injury. °· You faint. °· Your burning or prickling feeling gets worse when walking. °· You have pain, cramps, or dizziness. °· You develop a rash. °MAKE SURE YOU: °· Understand these instructions. °· Will watch your condition. °· Will get help right away if you are not doing well or get worse. °Document Released: 02/18/2002 Document Revised: 05/23/2011 Document Reviewed: 11/19/2010 °ExitCare® Patient Information ©2014 [employer]. ° °

## 2013-04-04 NOTE — Telephone Encounter (Signed)
Patient Information:  Caller Name: Wyatt Mageabitha  Phone: [phone]  Patient: Jillian Fleming, Jillian Fleming  Gender: Female  DOB: [birth year]  Age: 33 Years  [facility]  Pregnant: No  Office Follow Up:  Does the office need to follow up with this patient?: No  Instructions For The Office: N/A  RN Note:  Notes 5" area on right lateral thigh at Epi-Pen injection site that feels swollen and numb  Currently 1 hour from office. Unable to get to office for 1115 appointment due to work schedule.   Symptoms  Reason For Call & Symptoms: 5" area of numbness on right lateral thigh after used Epi-Pen for food allergy 04/01/13 at 1930.  Called 911; taken to [facility] for anaphylaxis.  Developed extreme facial edema and hives.  Currently on Prednisone.   Was not aware of any thigh numbness until 04/03/13.  Reviewed Health History In EMR: Yes  Reviewed Medications In EMR: Yes  Reviewed Allergies In EMR: Yes  Reviewed Surgeries / Procedures: Yes  Date of Onset of Symptoms: 04/03/2013 OB / GYN:  LMP: 03/20/2013  Guideline(s) Used:  Neurologic Deficit  Disposition Per Guideline:   Go to Office Now  Reason For Disposition Reached:   Neurologic deficit of gradual onset, ANY of the following:   Weakness of the face, arm, or leg on one side of the body  Numbness of the face, arm, or leg on one side of the body  Loss of speech or garbled speech  Advice Given:  Call Back If:  You become worse.  Patient Will Follow Care Advice:  YES  Appointment Scheduled:  04/04/2013 11:45:00 Appointment Scheduled Provider:  Nicki ReaperBaity, Regina

## 2013-05-03 ENCOUNTER — Other Ambulatory Visit: Payer: Self-pay | Admitting: Family Medicine

## 2013-05-03 MED ORDER — OSELTAMIVIR PHOSPHATE 75 MG PO CAPS: 75.0000 mg | ORAL_CAPSULE | Freq: Two times a day (BID) | ORAL | Status: DC

## 2013-05-04 NOTE — Progress Notes (Signed)
Family member dx'd with flu.  Rx for appropriate dose of tamiflu given to patient to use if sx develop.  Routine instructions and cautions given.

## 2013-11-11 ENCOUNTER — Ambulatory Visit (INDEPENDENT_AMBULATORY_CARE_PROVIDER_SITE_OTHER): Payer: BC Managed Care – PPO | Admitting: Family Medicine

## 2013-11-11 ENCOUNTER — Encounter: Payer: Self-pay | Admitting: Family Medicine

## 2013-11-11 VITALS — BP 122/78 | HR 88 | Temp 98.3°F | Ht 63.75 in | Wt 182.0 lb

## 2013-11-11 DIAGNOSIS — R109 Unspecified abdominal pain: Secondary | ICD-10-CM

## 2013-11-11 DIAGNOSIS — N3000 Acute cystitis without hematuria: Secondary | ICD-10-CM

## 2013-11-11 DIAGNOSIS — N926 Irregular menstruation, unspecified: Secondary | ICD-10-CM

## 2013-11-11 DIAGNOSIS — Z331 Pregnant state, incidental: Secondary | ICD-10-CM

## 2013-11-11 DIAGNOSIS — N390 Urinary tract infection, site not specified: Secondary | ICD-10-CM | POA: Insufficient documentation

## 2013-11-11 DIAGNOSIS — Z3490 Encounter for supervision of normal pregnancy, unspecified, unspecified trimester: Secondary | ICD-10-CM | POA: Insufficient documentation

## 2013-11-11 LAB — POCT URINALYSIS DIPSTICK
BILIRUBIN UA: NEGATIVE
GLUCOSE UA: NEGATIVE
Ketones, UA: NEGATIVE
NITRITE UA: NEGATIVE
Protein, UA: NEGATIVE
RBC UA: NEGATIVE
Spec Grav, UA: 1.02
UROBILINOGEN UA: 0.2
pH, UA: 6

## 2013-11-11 LAB — POCT UA - MICROSCOPIC ONLY

## 2013-11-11 LAB — POCT URINE PREGNANCY: Preg Test, Ur: POSITIVE

## 2013-11-11 MED ORDER — CEPHALEXIN 250 MG PO CAPS: 250.0000 mg | ORAL_CAPSULE | Freq: Two times a day (BID) | ORAL | Status: DC

## 2013-11-11 NOTE — Assessment & Plan Note (Signed)
In newly pregnant female tx with keflex 250 mg bid  Enc fluids ucx ordered

## 2013-11-11 NOTE — Patient Instructions (Addendum)
Get a prenatal vitamin otc  Take your aspirin daily as directed by gyn  Stop at check out for ref to gyn  Take the keflex as directed and drink lots of water  Change to zyrtec 5 mg daily for allergies

## 2013-11-11 NOTE — Assessment & Plan Note (Signed)
New pregnancy - almost 5 wk by dates that sound reliable  Pt is happy  Will begin PNV Avoid otc med except for zyrtec for allergies prn and asa low dose as recommended by gyn Ref to her obgyn in [facility] - will likely need to start back on lovenox for her coagulopathy  Pt is knowledgeable and has 2 healthy children

## 2013-11-11 NOTE — Progress Notes (Signed)
Subjective:    Patient ID: Jillian Fleming, female    DOB: [birth year], 33 y.o.   MRN: [id number]  HPI Here for back and flank pain   Urine preg test is pos today  Home test was neg a week ago  Has not been using any birth control  No nausea More tired than normal however   Has 2 children  Pregnancies were uneventful  Had to take lovenox due to coagulopathy  She sees gyn - [facility]     Does not tend to get utis very often   LMP was 7/28  It was pretty normal for her  She is quite regular usually   Back symptoms -she has pain in low back  No urinary symptoms  Some flank pain on L side  Most uncomfortable sitting and then standing - better and then worse again  Not shooting down her leg  No meds otc at all   She used flonase and allegra sat     Results for orders placed in visit on 11/11/13  POCT URINALYSIS DIPSTICK      Result Value Ref Range   Color, UA yellow     Clarity, UA clear     Glucose, UA neg.     Bilirubin, UA neg.     Ketones, UA neg.     Spec Grav, UA 1.020     Blood, UA neg.     pH, UA 6.0     Protein, UA neg.     Urobilinogen, UA 0.2     Nitrite, UA neg.     Leukocytes, UA moderate (2+)    POCT URINE PREGNANCY      Result Value Ref Range   Preg Test, Ur Positive       Patient Active Problem List   Diagnosis Date Noted  . Pregnancy 11/11/2013  . UTI (urinary tract infection) 11/11/2013  . Food allergy 02/27/2013  . Routine general medical examination at a health care facility 02/19/2013  . OVERWEIGHT 04/07/2009  . COAGULOPATHY 04/07/2009   Past Medical History  Diagnosis Date  . History of antiphospholipid syndrome     is on asa and has to have lovenox for pregnancy  . History of miscarriage   . History of blood transfusion 2005    after miscarriage   Past Surgical History  Procedure Laterality Date  . Hysteroscopy      surgery to remove scar tissue  . Dilation and curettage of uterus    . Biopsy breast  2001    History  Substance Use Topics  . Smoking status: Never Smoker   . Smokeless tobacco: Not on file  . Alcohol Use: No   Family History  Problem Relation Age of Onset  . Hypertension Mother    No Known Allergies Current Outpatient Prescriptions on File Prior to Visit  Medication Sig Dispense Refill  . EPINEPHrine (EPI-PEN) 0.3 mg/0.3 mL SOAJ injection Inject 0.3 mLs (0.3 mg total) into the muscle once. As needed for allergic reaction  1 Device  1   No current facility-administered medications on file prior to visit.    Review of Systems    Review of Systems  Constitutional: Negative for fever, appetite change,  and unexpected weight change.  Eyes: Negative for pain and visual disturbance.  Respiratory: Negative for cough and shortness of breath.   Cardiovascular: Negative for cp or palpitations    Gastrointestinal: Negative for nausea, diarrhea and constipation.  Genitourinary: Negative for urgency and  frequency. neg for vaginal d/c , neg for hematuria  Skin: Negative for pallor or rash   MSK pos for low back pain/ flank pain on L Neurological: Negative for weakness, light-headedness, numbness and headaches.  Hematological: Negative for adenopathy. Does not bruise/bleed easily.  Psychiatric/Behavioral: Negative for dysphoric mood. The patient is not nervous/anxious.      Objective:   Physical Exam  Constitutional: She appears well-developed and well-nourished. No distress.  obese and well appearing   HENT:  Head: Normocephalic and atraumatic.  Mouth/Throat: Oropharynx is clear and moist.  Eyes: Conjunctivae and EOM are normal. Pupils are equal, round, and reactive to light. Right eye exhibits no discharge. Left eye exhibits no discharge. No scleral icterus.  Neck: Normal range of motion. Neck supple. No JVD present. No thyromegaly present.  Cardiovascular: Normal rate and regular rhythm.   No murmur heard. Pulmonary/Chest: Effort normal and breath sounds normal. No  respiratory distress. She has no wheezes. She has no rales.  Abdominal: Soft. Bowel sounds are normal. She exhibits no distension and no mass. There is no tenderness. There is no rebound and no guarding.  No suprapubic tenderness or fullness  No cva tenderness   Musculoskeletal: Normal range of motion. She exhibits no edema.  Nl rom spine   Lymphadenopathy:    She has no cervical adenopathy.  Neurological: She is alert. She has normal reflexes.  Skin: Skin is warm and dry. No rash noted.  Psychiatric: She has a normal mood and affect.          Assessment & Plan:   Problem List Items Addressed This Visit     Genitourinary   UTI (urinary tract infection)     In newly pregnant female tx with keflex 250 mg bid  Enc fluids ucx ordered     Relevant Medications      cephALEXin (KEFLEX) capsule   Other Relevant Orders      Urine culture      POCT UA - Microscopic Only (Completed)     Other   Pregnancy     New pregnancy - almost 5 wk by dates that sound reliable  Pt is happy  Will begin PNV Avoid otc med except for zyrtec for allergies prn and asa low dose as recommended by gyn Ref to her obgyn in [address] - will likely need to start back on lovenox for her coagulopathy  Pt is knowledgeable and has 2 healthy children      Relevant Orders      Ambulatory referral to Obstetrics / Gynecology    Other Visit Diagnoses   Flank pain    -  Primary    Relevant Orders       POCT urinalysis dipstick (Completed)       Urine culture    Missed period        Relevant Orders       POCT urine pregnancy (Completed)

## 2013-11-11 NOTE — Progress Notes (Signed)
Pre visit review using our clinic review tool, if applicable. No additional management support is needed unless otherwise documented below in the visit note. 

## 2013-11-13 LAB — URINE CULTURE

## 2014-01-18 ENCOUNTER — Inpatient Hospital Stay (HOSPITAL_COMMUNITY): Payer: BC Managed Care – PPO

## 2014-01-18 ENCOUNTER — Encounter (HOSPITAL_COMMUNITY): Payer: Self-pay | Admitting: *Deleted

## 2014-01-18 ENCOUNTER — Inpatient Hospital Stay (HOSPITAL_COMMUNITY)
Admission: AD | Admit: 2014-01-18 | Discharge: 2014-01-18 | Disposition: A | Discharge status: Home / Self Care | Payer: BC Managed Care – PPO | Source: Ambulatory Visit | Attending: Obstetrics and Gynecology | Admitting: Obstetrics and Gynecology

## 2014-01-18 DIAGNOSIS — O4692 Antepartum hemorrhage, unspecified, second trimester: Secondary | ICD-10-CM

## 2014-01-18 DIAGNOSIS — O039 Complete or unspecified spontaneous abortion without complication: Secondary | ICD-10-CM

## 2014-01-18 DIAGNOSIS — Z3A14 14 weeks gestation of pregnancy: Secondary | ICD-10-CM | POA: Diagnosis not present

## 2014-01-18 LAB — CBC
HEMATOCRIT: 37.9 % (ref 36.0–46.0)
HEMOGLOBIN: 12.6 g/dL (ref 12.0–15.0)
MCH: 29.6 pg (ref 26.0–34.0)
MCHC: 33.2 g/dL (ref 30.0–36.0)
MCV: 89.2 fL (ref 78.0–100.0)
Platelets: 257 10*3/uL (ref 150–400)
RBC: 4.25 MIL/uL (ref 3.87–5.11)
RDW: 12.1 % (ref 11.5–15.5)
WBC: 10.3 10*3/uL (ref 4.0–10.5)

## 2014-01-18 LAB — HCG, QUANTITATIVE, PREGNANCY: hCG, Beta Chain, Quant, S: 260 m[IU]/mL — ABNORMAL HIGH

## 2014-01-18 MED ORDER — ACETAMINOPHEN 500 MG PO TABS
1000.0000 mg | ORAL_TABLET | ORAL | Status: AC
  Administered 2014-01-18: 1000 mg via ORAL
  Filled 2014-01-18: qty 2

## 2014-01-18 MED ORDER — IBUPROFEN 600 MG PO TABS: 600.0000 mg | ORAL_TABLET | Freq: Four times a day (QID) | ORAL | Status: DC | PRN

## 2014-01-18 NOTE — MAU Note (Signed)
Spotting started two days ago, cramping started at 1100 this morning, pt then felt what she described as her water breaking.  Brought in what she passed. Denies cramping right now but having some bleeding.

## 2014-01-18 NOTE — Discharge Instructions (Signed)
Miscarriage A miscarriage is the sudden loss of an unborn baby (fetus) before the 20th week of pregnancy. Most miscarriages happen in the first 3 months of pregnancy. Sometimes, it happens before a woman even knows she is pregnant. A miscarriage is also called a "spontaneous miscarriage" or "early pregnancy loss." Having a miscarriage can be an emotional experience. Talk with your caregiver about any questions you may have about miscarrying, the grieving process, and your future pregnancy plans. CAUSES   Problems with the fetal chromosomes that make it impossible for the baby to develop normally. Problems with the baby's genes or chromosomes are most often the result of errors that occur, by chance, as the embryo divides and grows. The problems are not inherited from the parents.  Infection of the cervix or uterus.   Hormone problems.   Problems with the cervix, such as having an incompetent cervix. This is when the tissue in the cervix is not strong enough to hold the pregnancy.   Problems with the uterus, such as an abnormally shaped uterus, uterine fibroids, or congenital abnormalities.   Certain medical conditions.   Smoking, drinking alcohol, or taking illegal drugs.   Trauma.  Often, the cause of a miscarriage is unknown.  SYMPTOMS   Vaginal bleeding or spotting, with or without cramps or pain.  Pain or cramping in the abdomen or lower back.  Passing fluid, tissue, or blood clots from the vagina. DIAGNOSIS  Your caregiver will perform a physical exam. You may also have an ultrasound to confirm the miscarriage. Blood or urine tests may also be ordered. TREATMENT   Sometimes, treatment is not necessary if you naturally pass all the fetal tissue that was in the uterus. If some of the fetus or placenta remains in the body (incomplete miscarriage), tissue left behind may become infected and must be removed. Usually, a dilation and curettage (D and C) procedure is performed.  During a D and C procedure, the cervix is widened (dilated) and any remaining fetal or placental tissue is gently removed from the uterus.  Antibiotic medicines are prescribed if there is an infection. Other medicines may be given to reduce the size of the uterus (contract) if there is a lot of bleeding.  If you have Rh negative blood and your baby was Rh positive, you will need a Rh immunoglobulin shot. This shot will protect any future baby from having Rh blood problems in future pregnancies. HOME CARE INSTRUCTIONS   Your caregiver may order bed rest or may allow you to continue light activity. Resume activity as directed by your caregiver.  Have someone help with home and family responsibilities during this time.   Keep track of the number of sanitary pads you use each day and how soaked (saturated) they are. Write down this information.   Do not use tampons. Do not douche or have sexual intercourse until approved by your caregiver.   Only take over-the-counter or prescription medicines for pain or discomfort as directed by your caregiver.   Do not take aspirin. Aspirin can cause bleeding.   Keep all follow-up appointments with your caregiver.   If you or your partner have problems with grieving, talk to your caregiver or seek counseling to help cope with the pregnancy loss. Allow enough time to grieve before trying to get pregnant again.  SEEK IMMEDIATE MEDICAL CARE IF:   You have severe cramps or pain in your back or abdomen.  You have a fever.  You pass large blood clots (walnut-sized   or larger) ortissue from your vagina. Save any tissue for your caregiver to inspect.   Your bleeding increases.   You have a thick, bad-smelling vaginal discharge.  You become lightheaded, weak, or you faint.   You have chills.  MAKE SURE YOU:  Understand these instructions.  Will watch your condition.  Will get help right away if you are not doing well or get  worse. Document Released: 08/24/2000 Document Revised: 06/25/2012 Document Reviewed: 04/19/2011 ExitCare Patient Information 2015 [employer]. This information is not intended to replace advice given to you by your health care provider. Make sure you discuss any questions you have with your health care provider.  

## 2014-01-18 NOTE — MAU Provider Note (Signed)
Chief Complaint: Vaginal Bleeding   First Provider Initiated Contact with Patient 01/18/14 1444     SUBJECTIVE HPI: Jillian Fleming is a 33 y.o. G3P2001 at 89107w4d by LMP who presents to maternity admissions reporting vaginal bleeding and cramping with large clot and maybe POCs passed today.  She brings with her a bag of clots and possible POCs.  She has antiphospholipid syndrome and takes Lovenox daily during pregnancy per [place].  She denies vaginal itching/burning, urinary symptoms, h/a, dizziness, n/v, or fever/chills.    Past Medical History  Diagnosis Date  . History of antiphospholipid syndrome     is on asa and has to have lovenox for pregnancy  . History of miscarriage   . History of blood transfusion 2005    after miscarriage   Past Surgical History  Procedure Laterality Date  . Hysteroscopy      surgery to remove scar tissue  . Dilation and curettage of uterus    . Biopsy breast  2001   History   Social History  . Marital Status: Married    Spouse Name: N/A    Number of Children: N/A  . Years of Education: N/A   Occupational History  . Not on file.   Social History Main Topics  . Smoking status: Never Smoker   . Smokeless tobacco: Not on file  . Alcohol Use: No  . Drug Use: No  . Sexual Activity: Not on file   Other Topics Concern  . Not on file   Social History Narrative   No current facility-administered medications on file prior to encounter.   Current Outpatient Prescriptions on File Prior to Encounter  Medication Sig Dispense Refill  . fluticasone (FLONASE) 50 MCG/ACT nasal spray Place 1 spray into both nostrils daily as needed for allergies or rhinitis.     . cephALEXin (KEFLEX) 250 MG capsule Take 1 capsule (250 mg total) by mouth 2 (two) times daily. 14 capsule 0  . EPINEPHrine (EPI-PEN) 0.3 mg/0.3 mL SOAJ injection Inject 0.3 mLs (0.3 mg total) into the muscle once. As needed for allergic reaction 1 Device 1   No Known  Allergies  ROS: Pertinent items in HPI  OBJECTIVE Last menstrual period 10/08/2013. GENERAL: Well-developed, well-nourished female in no acute distress.  HEENT: Normocephalic HEART: normal rate RESP: normal effort ABDOMEN: Soft, non-tender EXTREMITIES: Nontender, no edema NEURO: Alert and oriented Pelvic exam: Cervix pink, visually closed, without lesion, small amount dark red bleeding without clots, vaginal walls and external genitalia normal Bimanual exam: Cervix 0/long/high, firm, anterior, neg CMT, uterus nontender, nonenlarged, adnexa without tenderness, enlargement, or mass  LAB RESULTS Results for orders placed or performed during the hospital encounter of 01/18/14 (from the past 24 hour(s))  CBC     Status: None   Collection Time: 01/18/14  3:02 PM  Result Value Ref Range   WBC 10.3 4.0 - 10.5 K/uL   RBC 4.25 3.87 - 5.11 MIL/uL   Hemoglobin 12.6 12.0 - 15.0 g/dL   HCT 16.137.9 09.636.0 - 04.546.0 %   MCV 89.2 78.0 - 100.0 fL   MCH 29.6 26.0 - 34.0 pg   MCHC 33.2 30.0 - 36.0 g/dL   RDW 40.912.1 81.111.5 - 91.415.5 %   Platelets 257 150 - 400 K/uL  hCG, quantitative, pregnancy     Status: Abnormal   Collection Time: 01/18/14  3:02 PM  Result Value Ref Range   hCG, Beta Chain, Quant, S 260 (H) <5 mIU/mL  IMAGING [place] Pelvis Complete  01/18/2014   CLINICAL DATA:  Status post spontaneous abortion. Patient was pregnant, passed fetal tissue/placental tissue. Evaluate for retained products of conception.  EXAM: TRANSABDOMINAL ULTRASOUND OF PELVIS  TECHNIQUE: Transabdominal ultrasound examination of the pelvis was performed including evaluation of the uterus, ovaries, adnexal regions, and pelvic cul-de-sac.  COMPARISON:  None.  FINDINGS: Uterus  Measurements: 11.1 cm x 5.3 cm x 6.1 cm. Two uterine fibroids, 1 from the right fundus measuring 2.5 cm in greatest dimension, and the other from the anterior mid to upper uterine segment measuring 13 mm. Both are sub serosal. No other uterine masses or lesions.   Endometrium  Thickness: 17 mm. Endometrium is heterogeneous. No areas of increased blood flow. No evidence of retained products of conception. No mass.  Right ovary  Measurements: 3.3 cm x 1.4 cm x 1.8 cm. Normal appearance/no adnexal mass.  Left ovary  Not visualized.  No left adnexal mass.  Other findings:  No free fluid  IMPRESSION: 1. Completed spontaneous abortion. No sonographic evidence of retained products of conception.   Electronically Signed   By: Amie Portlandavid  Ormond M.D.   On: 01/18/2014 16:05    ASSESSMENT 1. Complete miscarriage   2. Vaginal bleeding in pregnancy, second trimester     PLAN Discharge home Continue Lovenox daily at this time [facility] [place] Ibuprofen 600 mg PO Q 6 hours PRN pain Return to MAU as needed for emergencies    Medication List    STOP taking these medications        cephALEXin 250 MG capsule  Commonly known as:  KEFLEX      TAKE these medications        aspirin 81 MG tablet  Take 81 mg by mouth daily.     enoxaparin 30 MG/0.3ML injection  Commonly known as:  LOVENOX  Inject 30 mg into the skin every 12 (twelve) hours.     EPINEPHrine 0.3 mg/0.3 mL Soaj injection  Commonly known as:  EPI-PEN  Inject 0.3 mLs (0.3 mg total) into the muscle once. As needed for allergic reaction     fluticasone 50 MCG/ACT nasal spray  Commonly known as:  FLONASE  Place 1 spray into both nostrils daily as needed for allergies or rhinitis.     ibuprofen 600 MG tablet  Commonly known as:  ADVIL,MOTRIN  Take 1 tablet (600 mg total) by mouth every 6 (six) hours as needed.     prenatal multivitamin Tabs tablet  Take 1 tablet by mouth daily.       Follow-up Information    Please follow up.   Why:  With your OB provider [place] this week.      [facility]   Why:  As needed for emergencies   Contact information:   [address] [id number] [place] [id number] [phone]      Sharen CounterLisa [place] Certified Nurse-Midwife 01/18/2014  5:58 PM

## 2014-01-28 ENCOUNTER — Telehealth: Payer: Self-pay | Admitting: Family Medicine

## 2014-01-28 DIAGNOSIS — F4323 Adjustment disorder with mixed anxiety and depressed mood: Secondary | ICD-10-CM | POA: Insufficient documentation

## 2014-01-28 NOTE — Telephone Encounter (Signed)
Referral for counseling - for adj reaction

## 2014-02-05 NOTE — Telephone Encounter (Signed)
Called patient to set up Psychology referral and she said she didn't want this appt

## 2014-09-22 ENCOUNTER — Telehealth: Payer: Self-pay

## 2014-09-22 NOTE — Telephone Encounter (Signed)
PLEASE NOTE: All timestamps contained within this report are represented as [place] Standard Time. CONFIDENTIALTY NOTICE: This fax transmission is intended only for the addressee. It contains information that is legally privileged, confidential or otherwise protected from use or disclosure. If you are not the intended recipient, you are strictly prohibited from reviewing, disclosing, copying using or disseminating any of this information or taking any action in reliance on or regarding this information. If you have received this fax in error, please notify us immediately by telephone so that we can arrange for its return to us. Phone: [phone], Toll-Free: [phone], Fax: [phone] Page: 1 of 2 Call Id: [id number] [facility] - Client TELEPHONE ADVICE RECORD [facility] Patient Name: Jillian [place] Gender: Female DOB: 05-12-1980 Age: 34 Y 1 M 17 D Return Phone Number: [phone] (Primary) Address: [place]: [place] [id number] Client [facility] - Client Client Site [facility] - Night Physician [place], [place] Contact Type Call Call Type Triage / Clinical Relationship To Patient Self Return Phone Number [phone] (Primary) Chief Complaint Vomiting Initial Comment Caller states [redacted] weeks pregnant and thinks she has food poisoning. Vomiting and diarrhea. PreDisposition Go to Urgent Care/Walk-In Clinic Nurse Assessment Nurse: Doylene Canardonner, RN, Lesa Date/Time Lamount Cohen(Eastern Time): 09/21/2014 11:00:04 AM Confirm and document reason for call. If symptomatic, describe symptoms. ---Caller states she is vomiting with diarrhea Has the patient traveled out of the country within the last 30 days? ---No Does the patient require triage? ---Yes Related visit to physician within the last 2 weeks? ---No Does the PT have any chronic conditions? (i.e. diabetes, asthma, etc.) ---No Did the patient  indicate they were pregnant? ---Yes How many weeks gestation? ---19 Have you felt decreased fetal movement? ---Early Pregnancy - No Fetal Movement Felt Yet Guidelines Guideline Title Affirmed Question Affirmed Notes Nurse Date/Time (Eastern Time) Pregnancy - Morning Sickness (Nausea and Vomiting of Pregnancy) MILD-MODERATE vomiting (e.g., 1-7 times / day)or nausea (all triage questions negative) Conner, RN, Lesa 09/21/2014 11:00:54 AM Disp. Time Lamount Cohen(Eastern Time) Disposition Final User 09/21/2014 11:07:00 AM Home Care Yes Conner, RN, Lesa Caller Understands: Yes Disagree/Comply: Comply PLEASE NOTE: All timestamps contained within this report are represented as [place] Standard Time. CONFIDENTIALTY NOTICE: This fax transmission is intended only for the addressee. It contains information that is legally privileged, confidential or otherwise protected from use or disclosure. If you are not the intended recipient, you are strictly prohibited from reviewing, disclosing, copying using or disseminating any of this information or taking any action in reliance on or regarding this information. If you have received this fax in error, please notify us immediately by telephone so that we can arrange for its return to us. Phone: [phone], Toll-Free: [phone], Fax: [phone] Page: 2 of 2 Call Id: [id number] Care Advice Given Per Guideline HOME CARE: You should be able to treat this at home. REASSURANCE: * It sounds like you are not dehydrated at this point. STEP 1 - CLEAR FLUIDS: * Sip small amounts of water, bouillon, or sports-rehydration liquid (Gatorade or Powerade). * Other options: 1/2 strength flat lemon-lime soda or ginger ale; Pedialyte popsicles * Goal: The goal is 1000 -1500 mL /day. This can be achieved by drinking 4 oz (120 mL) per hour for 12 hours. STEP 2 - DIET - CRACKERS: * Starchy foods are most easily handled by the stomach * Eat small frequent meals ('grazing throughout the  day') * Try eating dry crackers or bread before getting  out of bed in the morning * Try crackers, ginger snap cookies, white bread, white rice, noodles, mashed potatoes, cereal, apple sauce, etc. * Other options: clear soup with rice or noodles. STEP 3 - DIET - STARCHES, CHICKEN, FISH: * Advance diet as tolerated. * Eat small frequent meals. Divide your food into 6 smaller meals per day. * Try soups, pasta dishes, mashed or baked potatoes, rice. * Try baked chicken or baked fish. * Eat foods that taste good to you * Avoid fatty and spicy foods, and foods with strong aromas. PRENATAL MULTI-VITAMIN: * Take one once-a-day multivitamin (with Folate) every day. * Take the pill with food. (Reason: minimize stomach irritation) * Some women find taking the pill at bedtime causes less nausea. VITAMIN B6 (PYRIDOXINE): * Research has shown that Vitamin B6 can help reduce symptoms of morning sickness for some women. It is available over the counter (OTC). * Dosage: Take 10-25 mg by mouth three times a day. Take it with food. STOP IRON PILLS: * Iron is an important element to help prevent or treat anemia. * It is a stomach irritant and should not be used at this point of your pregnancy since you are vomiting. CALL BACK IF: * Fever, abdominal pain, or vaginal bleeding occur * Unable to keep any liquids down * Severe diarrhea occurs (many watery BMs) * No urination over12 hours * You become worse. CARE ADVICE per Pregnancy - Morning Sickness (Adult) guideline. After Care Instructions Given Call Event Type User Date / Time Description

## 2014-09-22 NOTE — Telephone Encounter (Signed)
Please check in on her later this afternoon to see how she is feeling

## 2014-09-22 NOTE — Telephone Encounter (Signed)
Called pt and she is doing a little better. Pt said she has only vomited once today and she has been able to stay hydrated and eat some rice which stayed down. Pt said she is okay for now but I did advise her if sxs worsen to call us back, pt verbalized understanding

## 2014-11-23 ENCOUNTER — Encounter (HOSPITAL_COMMUNITY): Payer: Self-pay | Admitting: *Deleted

## 2015-01-09 IMAGING — US US PELVIS COMPLETE
1 series · 14 of 25 positions shown · non-contrast
Comparison: None.

CLINICAL DATA: Status post spontaneous abortion. Patient was
pregnant, passed fetal tissue/placental tissue. Evaluate for
retained products of conception.

EXAM:
TRANSABDOMINAL ULTRASOUND OF PELVIS
TECHNIQUE: Transabdominal ultrasound examination of the pelvis was performed
including evaluation of the uterus, ovaries, adnexal regions, and
pelvic cul-de-sac.

[Series 1: us pelvis complete · 0.22mm/px · 14 of 29 slices shown]
[im 1/29]
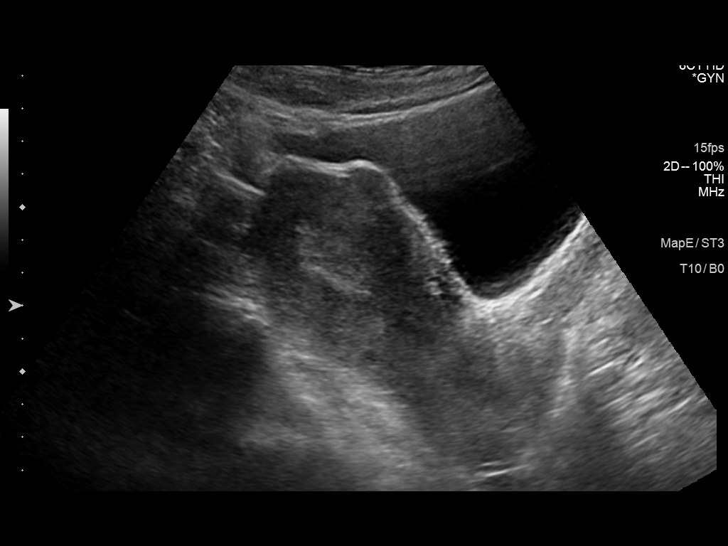
[im 3/29]
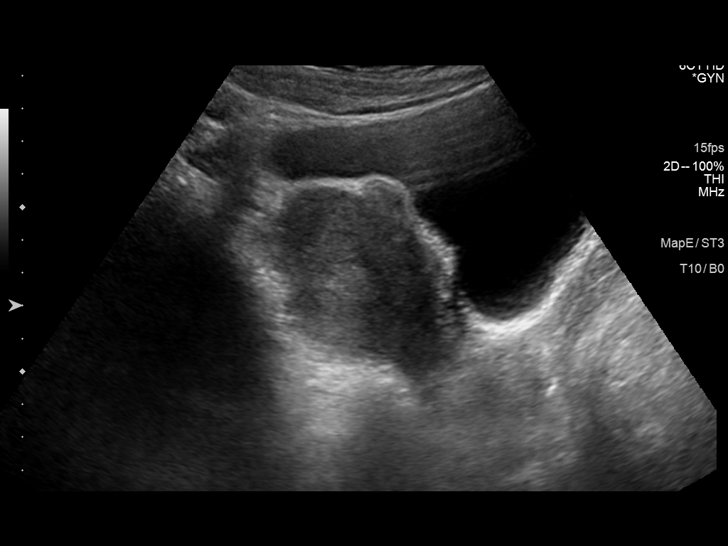
[im 5/29]
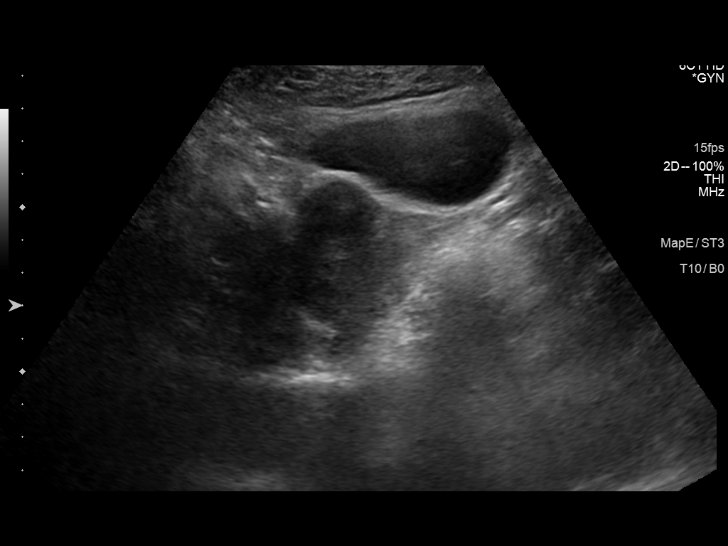
[im 8/29]
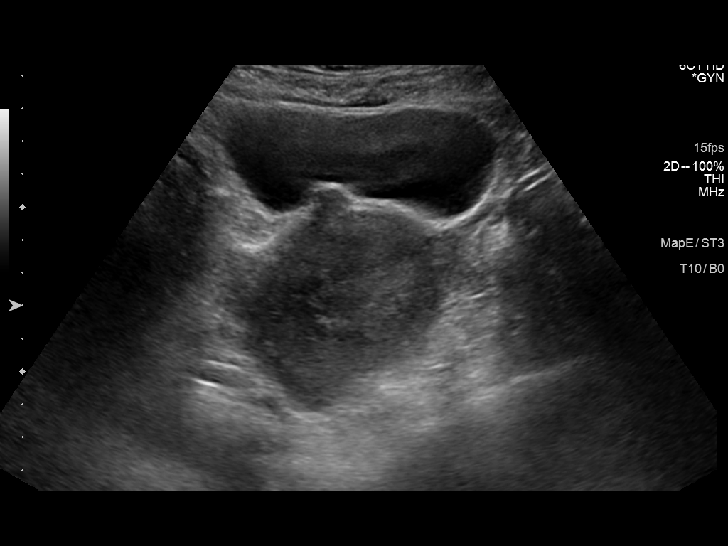
[im 10/29]
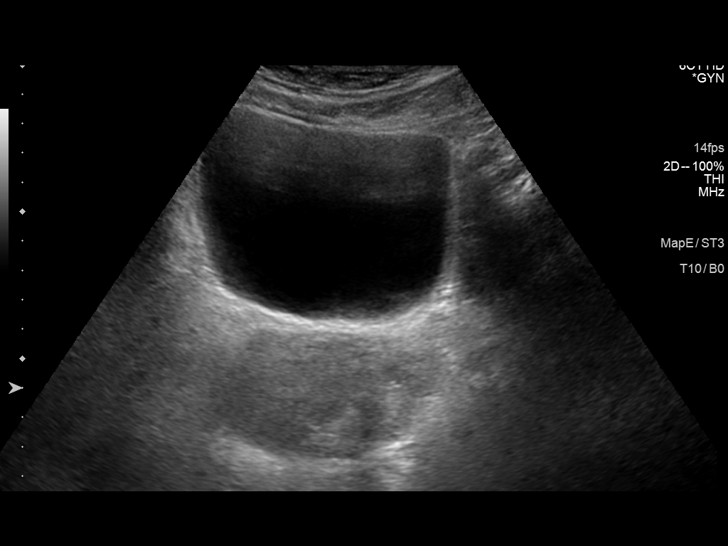
[im 11/29]
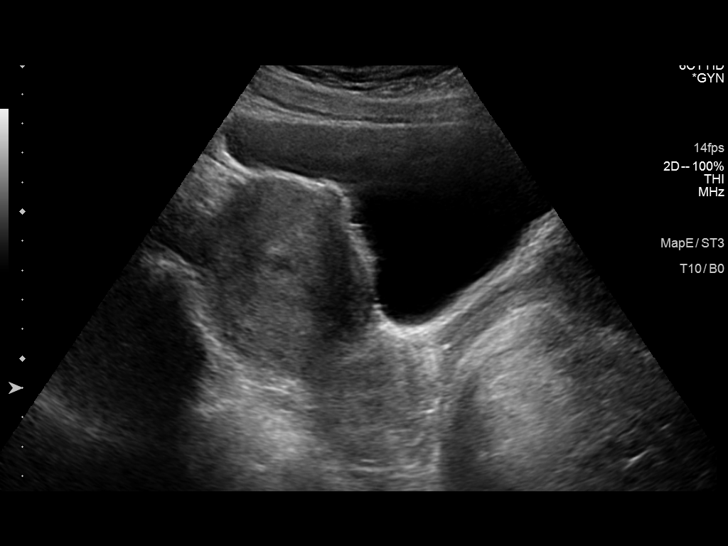
[im 13/29]
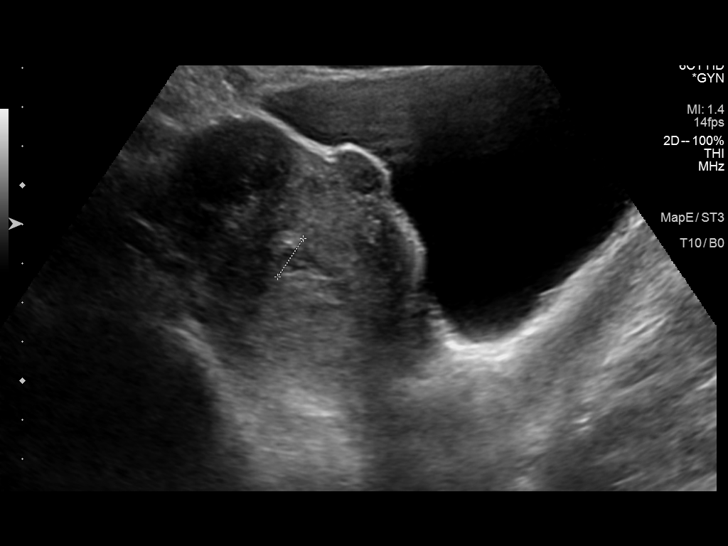
[im 16/29]
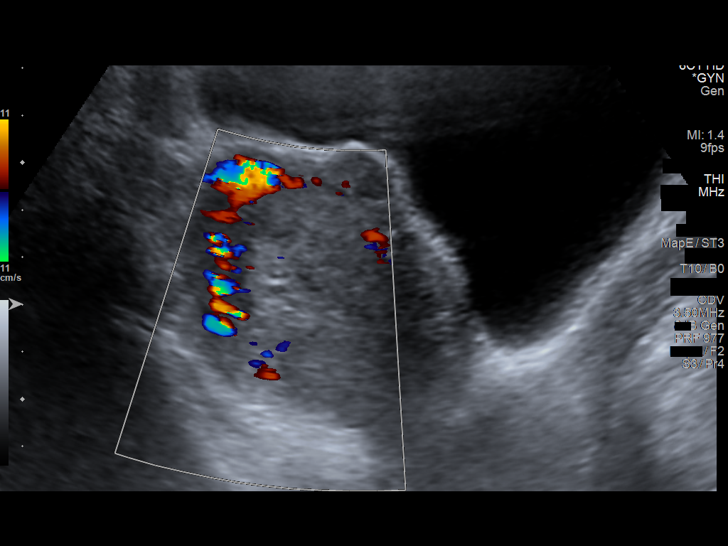
[im 18/29]
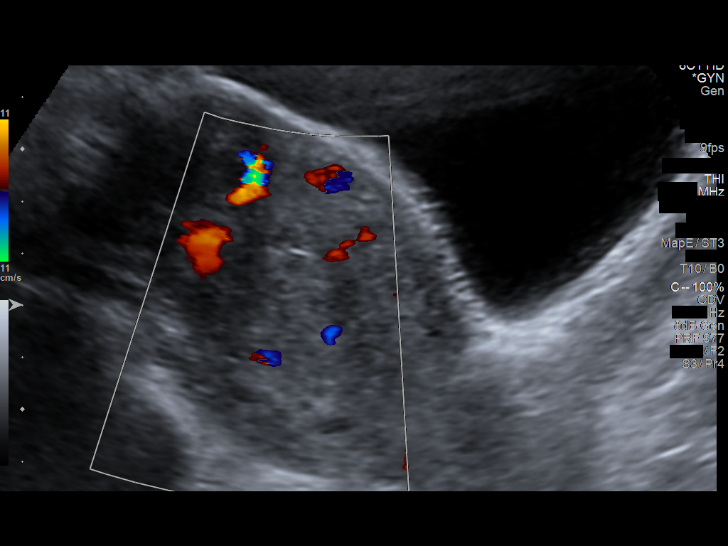
[im 19/29]
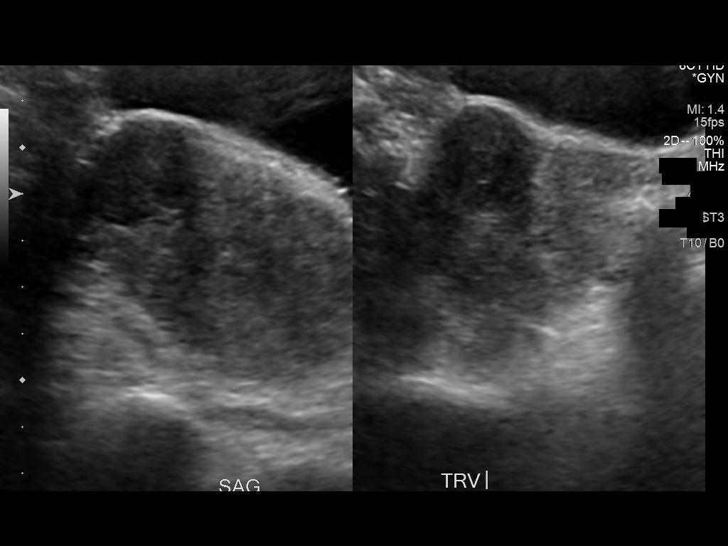
[im 22/29]
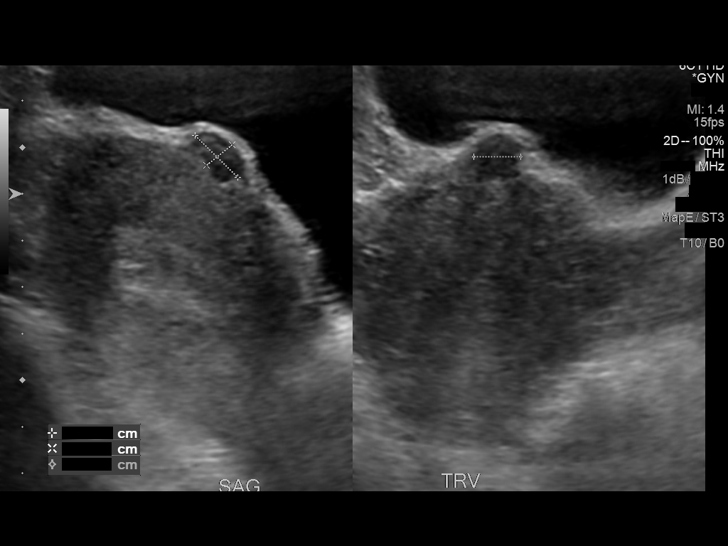
[im 24/29]
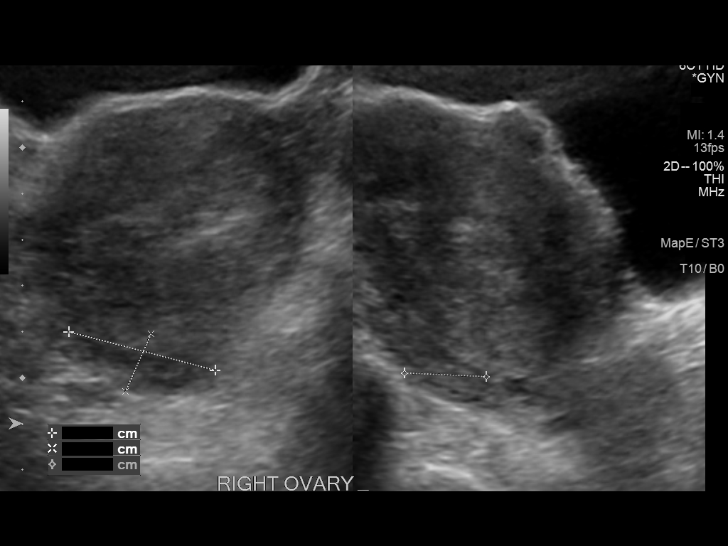
[im 26/29]
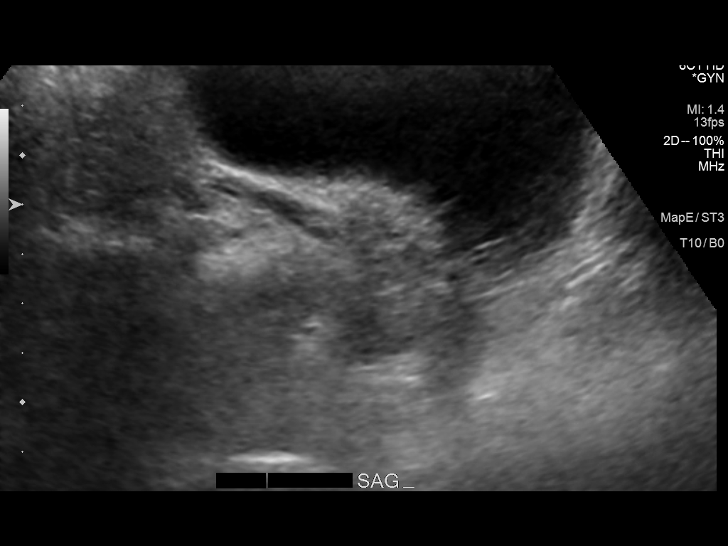
[im 29/29]
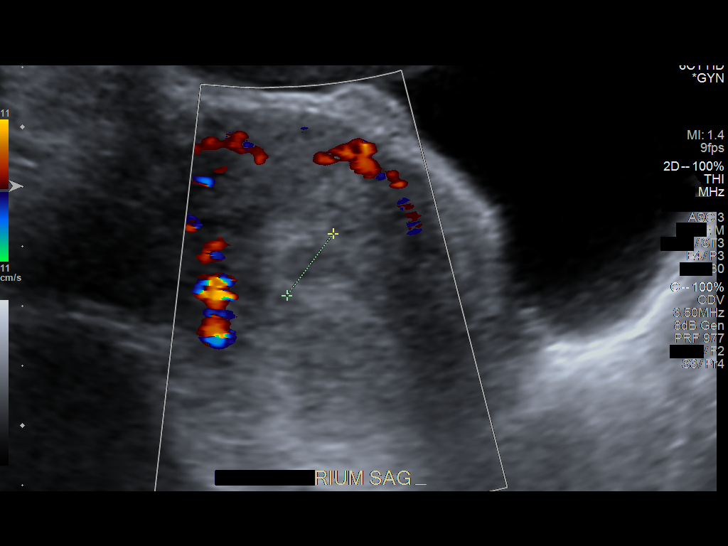

[14 of 25 positions shown; findings below may reference images not displayed]

FINDINGS: Uterus

Measurements: 11.1 cm x 5.3 cm x 6.1 cm. Two uterine fibroids, 1
from the right fundus measuring 2.5 cm in greatest dimension, and
the other from the anterior mid to upper uterine segment measuring
13 mm. Both are sub serosal. No other uterine masses or lesions.

Endometrium

Thickness: 17 mm. Endometrium is heterogeneous. No areas of
increased blood flow. No evidence of retained products of
conception. No mass.

Right ovary

Measurements: 3.3 cm x 1.4 cm x 1.8 cm. Normal appearance/no adnexal
mass.

Left ovary

Not visualized.  No left adnexal mass.

Other findings:  No free fluid
IMPRESSION: 1. Completed spontaneous abortion. No sonographic evidence of
retained products of conception.

## 2015-02-09 ENCOUNTER — Ambulatory Visit (INDEPENDENT_AMBULATORY_CARE_PROVIDER_SITE_OTHER): Payer: BC Managed Care – PPO | Admitting: Family Medicine

## 2015-02-09 ENCOUNTER — Encounter: Payer: Self-pay | Admitting: Family Medicine

## 2015-02-09 ENCOUNTER — Ambulatory Visit: Payer: BC Managed Care – PPO | Admitting: Family Medicine

## 2015-02-09 VITALS — BP 132/86 | HR 64 | Temp 97.8°F | Ht 63.75 in | Wt 174.0 lb

## 2015-02-09 DIAGNOSIS — J069 Acute upper respiratory infection, unspecified: Secondary | ICD-10-CM | POA: Diagnosis not present

## 2015-02-09 DIAGNOSIS — B9789 Other viral agents as the cause of diseases classified elsewhere: Principal | ICD-10-CM

## 2015-02-09 MED ORDER — HYDROCODONE-HOMATROPINE 5-1.5 MG/5ML PO SYRP: 5.0000 mL | ORAL_SOLUTION | Freq: Three times a day (TID) | ORAL | Status: DC | PRN

## 2015-02-09 NOTE — Assessment & Plan Note (Signed)
Disc symptomatic care - see instructions on AVS  Fluids/rest (when able) , hycodan for cough with caution of sedation  Antihistamine prn  Motrin prn  Update if not starting to improve in a week or if worsening  -esp worse cough/fever or sinus pain

## 2015-02-09 NOTE — Progress Notes (Signed)
Pre visit review using our clinic review tool, if applicable. No additional management support is needed unless otherwise documented below in the visit note. 

## 2015-02-09 NOTE — Patient Instructions (Addendum)
Drink lots of fluids Rest when you can  Try zyrtec 10 mg daily for runny nose  Get back on flonase also  Px motrin is ok as well  Hycodan cough syrup is ok when not working or driving   Update if not starting to improve in a week or if worsening

## 2015-02-09 NOTE — Progress Notes (Signed)
Subjective:    Patient ID: Jillian [place], female    DOB: 19-Mar-1980, 34 y.o.   MRN: [id number]  HPI Here with uri symptoms for 2 days  Congestion and runny nose  Coughing -productive in the am - brown in color  Headache - top of her head and some in the sinuses   No fever at home  Has had night sweats post pregnancy however  No body aches   Otc: robitussin and px motrin  Has not been using her flonase     Girls are sick at home with colds   No longer breast feeding    Patient Active Problem List   Diagnosis Date Noted  . Viral URI with cough 02/09/2015  . 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 Pregnancy 11/11/2013  . UTI (urinary tract infection) 11/11/2013  . Food allergy 02/27/2013  . Routine general medical examination at a health care facility 02/19/2013  . OVERWEIGHT 04/07/2009  . COAGULOPATHY 04/07/2009   Past Medical History  Diagnosis Date  . History of antiphospholipid syndrome     is on asa and has to have lovenox for pregnancy  . History of miscarriage   . History of blood transfusion 2005    after miscarriage   Past Surgical History  Procedure Laterality Date  . Hysteroscopy      surgery to remove scar tissue  . Dilation and curettage of uterus    . Biopsy breast  2001   Social History  Substance Use Topics  . Smoking status: Never Smoker   . Smokeless tobacco: None  . Alcohol Use: No   Family History  Problem Relation Age of Onset  . Hypertension Mother    No Known Allergies Current Outpatient Prescriptions on File Prior to Visit  Medication Sig Dispense Refill  . aspirin 81 MG tablet Take 81 mg by mouth daily.    Marland Kitchen EPINEPHrine (EPI-PEN) 0.3 mg/0.3 mL SOAJ injection Inject 0.3 mLs (0.3 mg total) into the muscle once. As needed for allergic reaction 1 Device 1  . fluticasone (FLONASE) 50 MCG/ACT nasal spray Place 1 spray into both nostrils daily as needed for allergies or rhinitis.     Marland Kitchen ibuprofen  (ADVIL,MOTRIN) 600 MG tablet Take 1 tablet (600 mg total) by mouth every 6 (six) hours as needed. 30 tablet 0  . Prenatal Vit-Fe Fumarate-FA (PRENATAL MULTIVITAMIN) TABS tablet Take 1 tablet by mouth daily.     No current facility-administered medications on file prior to visit.    Review of Systems Review of Systems  Constitutional: Negative for fever, appetite change,  and unexpected weight change. pos for fatigue and malaise ENT pos for cong and rhinorrhea and sinus pressure  Eyes: Negative for pain and visual disturbance.  Respiratory: Negative for wheeze  and shortness of breath.   Cardiovascular: Negative for cp or palpitations    Gastrointestinal: Negative for nausea, diarrhea and constipation.  Genitourinary: Negative for urgency and frequency.  Skin: Negative for pallor or rash   Neurological: Negative for weakness, light-headedness, numbness and headaches.  Hematological: Negative for adenopathy. Does not bruise/bleed easily.  Psychiatric/Behavioral: Negative for dysphoric mood. The patient is not nervous/anxious.         Objective:   Physical Exam  Constitutional: She appears well-developed and well-nourished. No distress.  Well appearing   HENT:  Head: Normocephalic and atraumatic.  Right Ear: External ear normal.  Left Ear: External ear normal.  Mouth/Throat: Oropharynx is clear and moist.  Nares are  injected and very congested bilaterally No sinus tenderness Clear rhinorrhea and post nasal drip   Eyes: Conjunctivae and EOM are normal. Pupils are equal, round, and reactive to light. Right eye exhibits no discharge. Left eye exhibits no discharge.  Neck: Normal range of motion. Neck supple.  Cardiovascular: Normal rate and normal heart sounds.   Pulmonary/Chest: Effort normal and breath sounds normal. No respiratory distress. She has no wheezes. She has no rales. She exhibits no tenderness.  Harsh bs Hacking cough  Lymphadenopathy:    She has no cervical  adenopathy.  Neurological: She is alert.  Skin: Skin is warm and dry. No rash noted.  Psychiatric: She has a normal mood and affect.  Nursing note and vitals reviewed.         Assessment & Plan:   Problem List Items Addressed This Visit      Respiratory   Viral URI with cough - Primary    Disc symptomatic care - see instructions on AVS  Fluids/rest (when able) , hycodan for cough with caution of sedation  Antihistamine prn  Motrin prn  Update if not starting to improve in a week or if worsening  -esp worse cough/fever or sinus pain

## 2015-03-16 LAB — HM PAP SMEAR

## 2015-04-10 ENCOUNTER — Ambulatory Visit: Payer: BC Managed Care – PPO | Admitting: Family Medicine

## 2015-04-10 ENCOUNTER — Encounter: Payer: Self-pay | Admitting: Family Medicine

## 2015-04-10 ENCOUNTER — Ambulatory Visit (INDEPENDENT_AMBULATORY_CARE_PROVIDER_SITE_OTHER): Payer: BC Managed Care – PPO | Admitting: Family Medicine

## 2015-04-10 VITALS — BP 120/82 | HR 75 | Temp 98.4°F | Wt 171.0 lb

## 2015-04-10 DIAGNOSIS — IMO0001 Reserved for inherently not codable concepts without codable children: Secondary | ICD-10-CM

## 2015-04-10 DIAGNOSIS — R030 Elevated blood-pressure reading, without diagnosis of hypertension: Secondary | ICD-10-CM | POA: Diagnosis not present

## 2015-04-10 DIAGNOSIS — I10 Essential (primary) hypertension: Secondary | ICD-10-CM | POA: Insufficient documentation

## 2015-04-10 NOTE — Patient Instructions (Signed)
Weight loss and exercise will help blood pressure Take a look at the DASH eating plan - this has you avoid more processed foods  BP is good today 120/82 It's best to check your bp after you have been sitting 10 minutes   If you decide you want ref to nutritionist please let me know

## 2015-04-10 NOTE — Progress Notes (Signed)
Subjective:    Patient ID: Jillian Fleming, female    DOB: 1980/05/17, 35 y.o.   MRN: [id number]  HPI Saw gyn 12/20  And bp was 132/95 Had her come back on 1/17 and it was still elevated  Only checked once each time and it was an electric / digital cuff  She was rushing and did have baby with her the 2nd time (had just walked from parking deck at [facility] with the car seat and baby  BP Readings from Last 3 Encounters:  04/10/15 120/86  02/09/15 132/86  01/18/14 132/92      Did not have HTN when she was pregnant   Has the mirina IUD now for contraception  Doing well for that   Wt is down 3 lb with bmi of 29  Is able to exercise once per week- elliptical -trying to find the time to get back into it  Eats fair - some salty stuff   She gained about 25 lb with last pregnancy  Ideal wt is 150    No supplements or wt loss drugs   Is not breast feeding   fam hx of HTN- mother and M family side   Patient Active Problem List   Diagnosis Date Noted  . Elevated BP 04/10/2015  . 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 Food allergy 02/27/2013  . Routine general medical examination at a health care facility 02/19/2013  . OVERWEIGHT 04/07/2009  . COAGULOPATHY 04/07/2009   Past Medical History  Diagnosis Date  . History of antiphospholipid syndrome     is on asa and has to have lovenox for pregnancy  . History of miscarriage   . History of blood transfusion 2005    after miscarriage   Past Surgical History  Procedure Laterality Date  . Hysteroscopy      surgery to remove scar tissue  . Dilation and curettage of uterus    . Biopsy breast  2001   Social History  Substance Use Topics  . Smoking status: Never Smoker   . Smokeless tobacco: None  . Alcohol Use: No   Family History  Problem Relation Age of Onset  . Hypertension Mother    No Known Allergies Current Outpatient Prescriptions on File Prior to Visit  Medication Sig Dispense Refill  .  aspirin 81 MG tablet Take 81 mg by mouth daily.    Marland Kitchen EPINEPHrine (EPI-PEN) 0.3 mg/0.3 mL SOAJ injection Inject 0.3 mLs (0.3 mg total) into the muscle once. As needed for allergic reaction 1 Device 1  . fluticasone (FLONASE) 50 MCG/ACT nasal spray Place 1 spray into both nostrils daily as needed for allergies or rhinitis.     Marland Kitchen ibuprofen (ADVIL,MOTRIN) 600 MG tablet Take 1 tablet (600 mg total) by mouth every 6 (six) hours as needed. 30 tablet 0  . Prenatal Vit-Fe Fumarate-FA (PRENATAL MULTIVITAMIN) TABS tablet Take 1 tablet by mouth daily.     No current facility-administered medications on file prior to visit.    Review of Systems Review of Systems  Constitutional: Negative for fever, appetite change, fatigue and unexpected weight change.  Eyes: Negative for pain and visual disturbance.  Respiratory: Negative for cough and shortness of breath.   Cardiovascular: Negative for cp or palpitations    Gastrointestinal: Negative for nausea, diarrhea and constipation.  Genitourinary: Negative for urgency and frequency.  Skin: Negative for pallor or rash   Neurological: Negative for weakness, light-headedness, numbness and headaches.  Hematological: Negative for adenopathy. Does  not bruise/bleed easily.  Psychiatric/Behavioral: Negative for dysphoric mood. The patient is not nervous/anxious.         Objective:   Physical Exam  Constitutional: She appears well-developed and well-nourished. No distress.  overwt and well app  HENT:  Head: Normocephalic and atraumatic.  Mouth/Throat: Oropharynx is clear and moist.  Eyes: Conjunctivae and EOM are normal. Pupils are equal, round, and reactive to light.  Neck: Normal range of motion. Neck supple. No JVD present. Carotid bruit is not present. No thyromegaly present.  Cardiovascular: Normal rate, regular rhythm, normal heart sounds and intact distal pulses.  Exam reveals no gallop.   Pulmonary/Chest: Effort normal and breath sounds normal. No  respiratory distress. She has no wheezes. She has no rales.  No crackles  Abdominal: Soft. Bowel sounds are normal. She exhibits no distension, no abdominal bruit and no mass. There is no tenderness.  Musculoskeletal: She exhibits no edema.  Lymphadenopathy:    She has no cervical adenopathy.  Neurological: She is alert. She has normal reflexes.  Skin: Skin is warm and dry. No rash noted.  Psychiatric: She has a normal mood and affect.          Assessment & Plan:   Problem List Items Addressed This Visit      Other   Elevated BP - Primary    Several time at gyn (of note-no pre eclampsia with recent pregnancy) Was checked with digital cuff after walking from the parking structure with car seat and baby- suspect not a resting bp (and did not re check) Here bp is ok  BP Readings from Last 3 Encounters:  04/10/15 120/82  02/09/15 132/86  01/18/14 132/92    Disc imp of healthy diet and exercise for better bp- copy of DASH eating plan given for this and weight loss attempt as well  Will continue to monitor

## 2015-04-10 NOTE — Progress Notes (Signed)
Pre visit review using our clinic review tool, if applicable. No additional management support is needed unless otherwise documented below in the visit note. 

## 2015-04-12 NOTE — Assessment & Plan Note (Signed)
Several time at gyn (of note-no pre eclampsia with recent pregnancy) Was checked with digital cuff after walking from the parking structure with car seat and baby- suspect not a resting bp (and did not re check) Here bp is ok  BP Readings from Last 3 Encounters:  04/10/15 120/82  02/09/15 132/86  01/18/14 132/92    Disc imp of healthy diet and exercise for better bp- copy of DASH eating plan given for this and weight loss attempt as well  Will continue to monitor

## 2015-04-15 ENCOUNTER — Telehealth: Payer: Self-pay | Admitting: Family Medicine

## 2015-04-15 DIAGNOSIS — IMO0001 Reserved for inherently not codable concepts without codable children: Secondary | ICD-10-CM

## 2015-04-15 DIAGNOSIS — E663 Overweight: Secondary | ICD-10-CM

## 2015-04-15 DIAGNOSIS — R030 Elevated blood-pressure reading, without diagnosis of hypertension: Secondary | ICD-10-CM

## 2015-04-15 NOTE — Telephone Encounter (Signed)
Pt wants ref to [facility] for nutritionist for elevated bp and also for desire for weight loss  How do I do that referral if it is not for dm ed?   Thanks  Will route to [employer]

## 2015-04-15 NOTE — Telephone Encounter (Signed)
Put REferral in for Medical Nutrition Therapy and use weight loss and elevated blood pressure as DX code. They will tell her if Insurance will cover  or not.

## 2015-04-15 NOTE — Telephone Encounter (Signed)
Pt would like referral to [facility]. Please advise.  Pt is aware they will call to schedule.

## 2015-04-16 NOTE — Telephone Encounter (Signed)
Thanks! done

## 2015-05-11 ENCOUNTER — Encounter: Payer: Self-pay | Admitting: Dietician

## 2015-05-11 ENCOUNTER — Encounter: Payer: BC Managed Care – PPO | Attending: Family Medicine | Admitting: Dietician

## 2015-05-11 VITALS — Ht 65.0 in | Wt 173.4 lb

## 2015-05-11 DIAGNOSIS — R030 Elevated blood-pressure reading, without diagnosis of hypertension: Secondary | ICD-10-CM

## 2015-05-11 DIAGNOSIS — E663 Overweight: Secondary | ICD-10-CM

## 2015-05-11 DIAGNOSIS — IMO0001 Reserved for inherently not codable concepts without codable children: Secondary | ICD-10-CM

## 2015-05-11 NOTE — Patient Instructions (Signed)
   Plan for at least one vegetable or fruit with each meal. Make fruits and veggies convenient at home.   Choose fewer processed foods and more fresh, unseasoned frozen foods.   Be especially careful with restaurant meals; limit toppings, condiments; eat 1/2 a meal and bring the rest home; avoid fried foods.   Use alternative seasonings like Mrs. Dash products. Check the mrs. Dash website for ideas.   Continue to include some exercise as often as possible.

## 2015-05-11 NOTE — Progress Notes (Signed)
Medical Nutrition Therapy: Visit start time: 1530  end time: 1630  Assessment:  Diagnosis: high blood pressure Past medical history: so other significant history Psychosocial issues/ stress concerns: patient reports moderate stress level Preferred learning method:  . Hands-on  Current weight: 173.4lbs  Height: 5'5" Medications, supplements: reviewed list in chart with patient  Progress and evaluation: Patient reports some weight increase in recent weeks. Lost weight after birth of baby 4 months ago, but is now gaining.         Had 2 episodes of elevated blood pressure at MD visits, but one subsequent reading was normal.           She reports busy schedule and little time for cooking.  Physical activity: no regular structure. Reports about 15 min walking at work.      Some gym visits with 30 min on elliptical.  Dietary Intake:  Usual eating pattern includes 3 meals and 1 snacks per day. Dining out frequency: 7-9 meals per week.  Breakfast: 9:30am biscuit or oatmeal Snack: none Lunch: 12-12:30 usually [employer] or [employer]: nutri-grain bar or pop tart or chips Supper: 7- 8:30pm spaghetti, chicken, sometimes out, wide variety Snack: none Beverages: water, loves coffee and sweet tea. Does not like sodas or fruit drinks. Rarely juice in am: apple or cran-grape.   Nutrition Care Education: Topics covered: blood pressure, weight management Basic nutrition: basic food groups, appropriate nutrient balance, appropriate meal and snack schedule, general nutrition guidelines    Weight control: benefits of weight control, determining reasonable weight goal, 1400kcal meal plan with 50%CHO, 20% protein, 30% fat Advanced nutrition:  recipe modification, cooking techniques, dining out, food label reading Diabetes:  goals for BGs, appropriate meal and snack schedule, Carb counting, appropriate carb intake and balance, sick day guidelines Hypertension:  importance of controlling BP, identifying  high sodium foods, identifying food sources of Calcium, potassium, magnesium Other lifestyle changes: role of exercise in controlling BP and weight.  Nutritional Diagnosis:  NI-5.10.2 Excessive mineral intake (specify): sodium As related to restaurant meals and processed foods.  As evidenced by patient report.  Intervention: Instruction as noted above.   Set goals with patient input.    Will offer additional help with meal options for busy schedule at follow-up visit.   Education Materials given:  . General diet guidelines for Hypertension  DASH diet guidelines . Food lists/ Planning A Balanced Meal . Goals/ instructions  Learner/ who was taught:  . Patient   Level of understanding: Marland Kitchen Verbalizes/ demonstrates competency  Demonstrated degree of understanding via:   Teach back Learning barriers: . None  Willingness to learn/ readiness for change: . Eager, change in progress  Monitoring and Evaluation:  Dietary intake, exercise, BP, and body weight      follow up: 06/03/15

## 2015-06-03 ENCOUNTER — Ambulatory Visit: Payer: BC Managed Care – PPO | Admitting: Dietician

## 2015-07-03 ENCOUNTER — Encounter: Payer: Self-pay | Admitting: Dietician

## 2015-07-03 NOTE — Progress Notes (Signed)
Have not heard back from patient to reschedule cancelled appointment. Sent discharge letter to MD.  

## 2015-12-01 ENCOUNTER — Encounter: Payer: Self-pay | Admitting: Family Medicine

## 2015-12-01 ENCOUNTER — Ambulatory Visit (INDEPENDENT_AMBULATORY_CARE_PROVIDER_SITE_OTHER): Payer: BC Managed Care – PPO | Admitting: Family Medicine

## 2015-12-01 VITALS — BP 122/72 | HR 75 | Temp 98.4°F | Ht 63.75 in | Wt 178.5 lb

## 2015-12-01 DIAGNOSIS — N63 Unspecified lump in unspecified breast: Secondary | ICD-10-CM | POA: Insufficient documentation

## 2015-12-01 NOTE — Patient Instructions (Signed)
Stop up front to work on referral for breast imaging  Avoid excessive caffeine

## 2015-12-01 NOTE — Progress Notes (Signed)
Subjective:    Patient ID: Jillian Fleming, female    DOB: 12-Dec-1980, 35 y.o.   MRN: [id number]  HPI Here for evaluation of a breast lump    R breast  Noticed about a month and 1/2 ago  No pain  Not getting bigger  No nipple discharge   Had a breast bx in 2001 -that was B9 in L breast   She has had lumps before -that go away on her own   Has an IUD -no menses    Patient Active Problem List   Diagnosis Date Noted  . Breast lump in female 12/01/2015  . Elevated BP 04/10/2015  . 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 Food allergy 02/27/2013  . Routine general medical examination at a health care facility 02/19/2013  . Overweight 04/07/2009  . COAGULOPATHY 04/07/2009   Past Medical History:  Diagnosis Date  . History of antiphospholipid syndrome    is on asa and has to have lovenox for pregnancy  . History of blood transfusion 2005   after miscarriage  . History of miscarriage    Past Surgical History:  Procedure Laterality Date  . BIOPSY BREAST  2001  . DILATION AND CURETTAGE OF UTERUS    . HYSTEROSCOPY     surgery to remove scar tissue   Social History  Substance Use Topics  . Smoking status: Never Smoker  . Smokeless tobacco: Never Used  . Alcohol use No   Family History  Problem Relation Age of Onset  . Hypertension Mother    No Known Allergies Current Outpatient Prescriptions on File Prior to Visit  Medication Sig Dispense Refill  . aspirin 81 MG tablet Take 81 mg by mouth daily.    Marland Kitchen. EPINEPHrine (EPI-PEN) 0.3 mg/0.3 mL SOAJ injection Inject 0.3 mLs (0.3 mg total) into the muscle once. As needed for allergic reaction 1 Device 1  . fluticasone (FLONASE) 50 MCG/ACT nasal spray Place 1 spray into both nostrils daily as needed for allergies or rhinitis.     Marland Kitchen. ibuprofen (ADVIL,MOTRIN) 600 MG tablet Take 1 tablet (600 mg total) by mouth every 6 (six) hours as needed. 30 tablet 0  . levonorgestrel (MIRENA) 20 MCG/24HR IUD 1 each by  Intrauterine route once.     No current facility-administered medications on file prior to visit.     Review of Systems Review of Systems  Constitutional: Negative for fever, appetite change, fatigue and unexpected weight change.  Eyes: Negative for pain and visual disturbance.  Respiratory: Negative for cough and shortness of breath.   Cardiovascular: Negative for cp or palpitations    Gastrointestinal: Negative for nausea, diarrhea and constipation.  Genitourinary: Negative for urgency and frequency. neg for menses with IUD ,neg for cramping  Skin: Negative for pallor or rash   Neurological: Negative for weakness, light-headedness, numbness and headaches.  Hematological: Negative for adenopathy. Does not bruise/bleed easily.  Psychiatric/Behavioral: Negative for dysphoric mood. The patient is not nervous/anxious.         Objective:   Physical Exam  Constitutional: She appears well-developed and well-nourished. No distress.  Well appearing   Eyes: Conjunctivae and EOM are normal. Pupils are equal, round, and reactive to light. No scleral icterus.  Neck: Normal range of motion. Neck supple.  Cardiovascular: Normal rate and regular rhythm.   Pulmonary/Chest: Effort normal and breath sounds normal. No respiratory distress. She has no wheezes. She has no rales.  Genitourinary:  Genitourinary Comments: Left breast:  No mass, nodules,  thickening, tenderness, bulging, retraction, inflamation, nipple discharge or skin changes noted.  No axillary or clavicular LA.   Dense tissue noted  Right breast:  Several cm oval smooth mass felt at approx 7:00 position , mobile and nt  No skin changes, inflammation, nipple d/c. No axillary or clavicular LA.  Dense tissue noted    Musculoskeletal: She exhibits no edema.  Lymphadenopathy:    She has no cervical adenopathy.  Neurological: She is alert.  Skin: Skin is warm and dry. No rash noted.  Psychiatric: She has a normal mood and affect.           Assessment & Plan:   Problem List Items Addressed This Visit      Other   Breast lump in female    Smooth oval lump felt at 7:00 pos in R breast-nontender w/o skin change or nipple d/c Ref for diag mm bilat and [place] or R breast Pending result Suspect this is a cyst  Plan from there       Relevant Orders   MM Digital Diagnostic Bilat   US BREAST COMPLETE UNI RIGHT INC AXILLA    Other Visit Diagnoses   None.

## 2015-12-01 NOTE — Assessment & Plan Note (Signed)
Smooth oval lump felt at 7:00 pos in R breast-nontender w/o skin change or nipple d/c Ref for diag mm bilat and US or R breast Pending result Suspect this is a cyst  Plan from there

## 2015-12-01 NOTE — Progress Notes (Signed)
Pre visit review using our clinic review tool, if applicable. No additional management support is needed unless otherwise documented below in the visit note. 

## 2016-04-10 ENCOUNTER — Telehealth: Payer: Self-pay | Admitting: Family Medicine

## 2016-04-10 DIAGNOSIS — Z0000 Encounter for general adult medical examination without abnormal findings: Secondary | ICD-10-CM

## 2016-04-10 NOTE — Telephone Encounter (Signed)
-----   Message from Baldomero LamyNatasha C Chavers sent at 04/08/2016  3:27 PM EST ----- Regarding: Cpx Tues 2/6, need orders. Thanks :-) Please order  future cpx labs for pt's upcoming lab appt. Thanks Rodney Boozeasha

## 2016-04-20 ENCOUNTER — Other Ambulatory Visit: Payer: BC Managed Care – PPO

## 2016-04-26 ENCOUNTER — Encounter: Payer: Self-pay | Admitting: Family Medicine

## 2016-04-26 ENCOUNTER — Ambulatory Visit (INDEPENDENT_AMBULATORY_CARE_PROVIDER_SITE_OTHER): Payer: BC Managed Care – PPO | Admitting: Family Medicine

## 2016-04-26 VITALS — BP 142/86 | HR 89 | Temp 97.6°F | Ht 64.0 in | Wt 174.5 lb

## 2016-04-26 DIAGNOSIS — J3089 Other allergic rhinitis: Secondary | ICD-10-CM

## 2016-04-26 DIAGNOSIS — J309 Allergic rhinitis, unspecified: Secondary | ICD-10-CM | POA: Insufficient documentation

## 2016-04-26 DIAGNOSIS — N63 Unspecified lump in unspecified breast: Secondary | ICD-10-CM | POA: Diagnosis not present

## 2016-04-26 DIAGNOSIS — Z23 Encounter for immunization: Secondary | ICD-10-CM

## 2016-04-26 DIAGNOSIS — E663 Overweight: Secondary | ICD-10-CM

## 2016-04-26 DIAGNOSIS — R030 Elevated blood-pressure reading, without diagnosis of hypertension: Secondary | ICD-10-CM | POA: Diagnosis not present

## 2016-04-26 DIAGNOSIS — Z0000 Encounter for general adult medical examination without abnormal findings: Secondary | ICD-10-CM

## 2016-04-26 DIAGNOSIS — D6861 Antiphospholipid syndrome: Secondary | ICD-10-CM

## 2016-04-26 MED ORDER — EPINEPHRINE 0.3 MG/0.3ML IJ SOAJ: 0.3000 mg | Freq: Once | INTRAMUSCULAR | 1 refills | Status: AC

## 2016-04-26 MED ORDER — FLUTICASONE PROPIONATE 50 MCG/ACT NA SUSP: 2.0000 | Freq: Every day | NASAL | 11 refills | Status: DC | PRN

## 2016-04-26 NOTE — Assessment & Plan Note (Signed)
Had to take lovenox with pregnancy

## 2016-04-26 NOTE — Progress Notes (Signed)
Subjective:    Patient ID: Jillian Fleming, female    DOB: [birth year], 36 y.o.   MRN: [id number]  HPI Here for health maintenance exam and to review chronic medical problems   Wt Readings from Last 3 Encounters:  04/26/16 174 lb 8 oz (79.2 kg)  12/01/15 178 lb 8 oz (81 kg)  05/11/15 173 lb 6.4 oz (78.7 kg)  she is trying to take care of herself  Busy with  3 kids  bmi is 29.9  HIV screening - neg with last pregnancy    Pap/gyn care -1/17 - was normal , with [facility] physicians  Self breast exam - has noticed R lump-no changes and re assuring imaging  Has the Mirena IUD  She gets a pap every other year - last one nl   Mammogram of R breast lump was b9/ also [place] (in care everywhere from [facility])   Flu vaccine -will get today Tetanus vaccine 1/10  Family hx- mother had hypertension   Taking sudafed due to sinus congestion today Needs flonase refill - she has been off of it  BP Readings from Last 3 Encounters:  04/26/16 (!) 142/86  12/01/15 122/72  04/10/15 120/82    Pulse Rate: 89   Wants wellness labs today   Uses elliptical for exercise Her diet is balanced - some kid food/ has to watch it  Not a snacker  occ skips breakfast   Patient Active Problem List   Diagnosis Date Noted  . Allergic rhinitis 04/26/2016  . Breast lump in female 12/01/2015  . Elevated blood pressure reading 04/10/2015  . 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 Food allergy 02/27/2013  . Routine general medical examination at a health care facility 02/19/2013  . Overweight 04/07/2009  . Anti-phospholipid antibody syndrome (HCC) 04/07/2009   Past Medical History:  Diagnosis Date  . History of antiphospholipid syndrome    is on asa and has to have lovenox for pregnancy  . History of blood transfusion 2005   after miscarriage  . History of miscarriage    Past Surgical History:  Procedure Laterality Date  . BIOPSY BREAST  2001  . DILATION AND CURETTAGE OF UTERUS    .  HYSTEROSCOPY     surgery to remove scar tissue   Social History  Substance Use Topics  . Smoking status: Never Smoker  . Smokeless tobacco: Never Used  . Alcohol use No   Family History  Problem Relation Age of Onset  . Hypertension Mother   . Prostate cancer Father    No Known Allergies Current Outpatient Prescriptions on File Prior to Visit  Medication Sig Dispense Refill  . aspirin 81 MG tablet Take 81 mg by mouth daily.    Marland Kitchen ibuprofen (ADVIL,MOTRIN) 600 MG tablet Take 1 tablet (600 mg total) by mouth every 6 (six) hours as needed. 30 tablet 0  . levonorgestrel (MIRENA) 20 MCG/24HR IUD 1 each by Intrauterine route once.     No current facility-administered medications on file prior to visit.     Review of Systems Review of Systems  Constitutional: Negative for fever, appetite change, fatigue and unexpected weight change.  Eyes: Negative for pain and visual disturbance.  ENT pos for nasal congestion  Respiratory: Negative for cough and shortness of breath.   Cardiovascular: Negative for cp or palpitations    Gastrointestinal: Negative for nausea, diarrhea and constipation.  Genitourinary: Negative for urgency and frequency. pos for unchanged breast mass Skin: Negative for pallor or  rash   Neurological: Negative for weakness, light-headedness, numbness and headaches.  Hematological: Negative for adenopathy. Does not bruise/bleed easily.  Psychiatric/Behavioral: Negative for dysphoric mood. The patient is not nervous/anxious.         Objective:   Physical Exam  Constitutional: She appears well-developed and well-nourished. No distress.  overwt and well appearing   HENT:  Head: Normocephalic and atraumatic.  Right Ear: External ear normal.  Left Ear: External ear normal.  Mouth/Throat: Oropharynx is clear and moist.  Eyes: Conjunctivae and EOM are normal. Pupils are equal, round, and reactive to light. No scleral icterus.  Neck: Normal range of motion. Neck supple. No  JVD present. Carotid bruit is not present. No thyromegaly present.  Cardiovascular: Normal rate, regular rhythm, normal heart sounds and intact distal pulses.  Exam reveals no gallop.   Pulmonary/Chest: Effort normal and breath sounds normal. No respiratory distress. She has no wheezes. She exhibits no tenderness.  Abdominal: Soft. Bowel sounds are normal. She exhibits no distension, no abdominal bruit and no mass. There is no tenderness.  Genitourinary: No breast swelling, tenderness, discharge or bleeding.  Genitourinary Comments: Breast exam: No  thickening, tenderness, bulging, retraction, inflamation, nipple discharge or skin changes noted.  No axillary or clavicular LA.  Stable R breast mass at 7:00 -mobile / round smooth and nt (cyst)      Musculoskeletal: Normal range of motion. She exhibits no edema or tenderness.  Lymphadenopathy:    She has no cervical adenopathy.  Neurological: She is alert. She has normal reflexes. No cranial nerve deficit. She exhibits normal muscle tone. Coordination normal.  Skin: Skin is warm and dry. No rash noted. No erythema. No pallor.  Psychiatric: She has a normal mood and affect.          Assessment & Plan:   Problem List Items Addressed This Visit      Respiratory   Allergic rhinitis    Congested today (? Also may have uri) Will get back on flonase Refilled today  Disc avoidance of allergens        Hematopoietic and Hemostatic   Anti-phospholipid antibody syndrome (HCC)    Had to take lovenox with pregnancy        Other   Breast lump in female    Stable/ R breast  Mm and [place] re assuring- cyst  Pt does not feel the need for asp or surg since it is not uncomfortable Will continue to watch      Elevated blood pressure reading    Suspect this is due to sudafed  Will re check when she is off of it  Disc DASH diet and also need for wt loss       Overweight    Discussed how this problem influences overall health and the risks it  imposes  Reviewed plan for weight loss with lower calorie diet (via better food choices and also portion control or program like weight watchers) and exercise building up to or more than 30 minutes 5 days per week including some aerobic activity   Limited by lack of time for self care      Routine general medical examination at a health care facility - Primary    Reviewed health habits including diet and exercise and skin cancer prevention Reviewed appropriate screening tests for age  Also reviewed health mt list, fam hx and immunization status , as well as social and family history   utd gyn care  Rev breast imaging  Wellness  labs today  See HPI Will need to re check bp off sudafed  Flu vaccine today Sees gyn for gyn care  Had HIV screen with last pregnancy       Other Visit Diagnoses    Need for influenza vaccination       Relevant Orders   Flu Vaccine QUAD 36+ mos IM (Completed)

## 2016-04-26 NOTE — Patient Instructions (Signed)
Labs today  Keep exercising  Eat a healthy balanced diet  Remind me to check your blood pressure the next time you are here (the sudafed is causing it to be high)  Get back on flonase  Breathe steam

## 2016-04-26 NOTE — Progress Notes (Signed)
Pre visit review using our clinic review tool, if applicable. No additional management support is needed unless otherwise documented below in the visit note. 

## 2016-04-27 LAB — COMPREHENSIVE METABOLIC PANEL
ALBUMIN: 4.7 g/dL (ref 3.5–5.2)
ALT: 10 U/L (ref 0–35)
AST: 12 U/L (ref 0–37)
Alkaline Phosphatase: 57 U/L (ref 39–117)
BUN: 11 mg/dL (ref 6–23)
CALCIUM: 9.4 mg/dL (ref 8.4–10.5)
CO2: 27 mEq/L (ref 19–32)
CREATININE: 0.85 mg/dL (ref 0.40–1.20)
Chloride: 103 mEq/L (ref 96–112)
GFR: 96.93 mL/min (ref >60.00)
Glucose, Bld: 79 mg/dL (ref 70–99)
Potassium: 3.5 mEq/L (ref 3.5–5.1)
Sodium: 137 mEq/L (ref 135–145)
Total Bilirubin: 0.5 mg/dL (ref 0.2–1.2)
Total Protein: 7.7 g/dL (ref 6.0–8.3)

## 2016-04-27 LAB — CBC WITH DIFFERENTIAL/PLATELET
BASOS ABS: 0.1 10*3/uL (ref 0.0–0.1)
Basophils Relative: 0.9 % (ref 0.0–3.0)
EOS ABS: 0.5 10*3/uL (ref 0.0–0.7)
Eosinophils Relative: 6.1 % — ABNORMAL HIGH (ref 0.0–5.0)
HEMATOCRIT: 42.1 % (ref 36.0–46.0)
Hemoglobin: 13.9 g/dL (ref 12.0–15.0)
LYMPHS PCT: 16.5 % (ref 12.0–46.0)
Lymphs Abs: 1.2 10*3/uL (ref 0.7–4.0)
MCHC: 33.1 g/dL (ref 30.0–36.0)
MCV: 90.4 fl (ref 78.0–100.0)
Monocytes Absolute: 0.6 10*3/uL (ref 0.1–1.0)
Monocytes Relative: 8 % (ref 3.0–12.0)
Neutro Abs: 5 10*3/uL (ref 1.4–7.7)
Neutrophils Relative %: 68.5 % (ref 43.0–77.0)
Platelets: 240 10*3/uL (ref 150.0–400.0)
RBC: 4.66 Mil/uL (ref 3.87–5.11)
RDW: 12.3 % (ref 11.5–15.5)
WBC: 7.4 10*3/uL (ref 4.0–10.5)

## 2016-04-27 LAB — TSH: TSH: 0.81 u[IU]/mL (ref 0.35–4.50)

## 2016-04-27 LAB — LIPID PANEL
CHOLESTEROL: 106 mg/dL (ref 0–200)
HDL: 37 mg/dL — ABNORMAL LOW (ref >39.00)
LDL Cholesterol: 59 mg/dL (ref 0–99)
NonHDL: 68.51
TRIGLYCERIDES: 49 mg/dL (ref 0.0–149.0)
Total CHOL/HDL Ratio: 3
VLDL: 9.8 mg/dL (ref 0.0–40.0)

## 2016-04-27 NOTE — Assessment & Plan Note (Signed)
Reviewed health habits including diet and exercise and skin cancer prevention Reviewed appropriate screening tests for age  Also reviewed health mt list, fam hx and immunization status , as well as social and family history   utd gyn care  Rev breast imaging  Wellness labs today  See HPI Will need to re check bp off sudafed  Flu vaccine today Sees gyn for gyn care  Had HIV screen with last pregnancy

## 2016-04-27 NOTE — Assessment & Plan Note (Signed)
Discussed how this problem influences overall health and the risks it imposes  Reviewed plan for weight loss with lower calorie diet (via better food choices and also portion control or program like weight watchers) and exercise building up to or more than 30 minutes 5 days per week including some aerobic activity   Limited by lack of time for self care

## 2016-04-27 NOTE — Assessment & Plan Note (Signed)
Congested today (? Also may have uri) Will get back on flonase Refilled today  Disc avoidance of allergens

## 2016-04-27 NOTE — Assessment & Plan Note (Signed)
Stable/ R breast  Mm and us re assuring- cyst  Pt does not feel the need for asp or surg since it is not uncomfortable Will continue to watch

## 2016-04-27 NOTE — Assessment & Plan Note (Signed)
Suspect this is due to sudafed  Will re check when she is off of it  Disc DASH diet and also need for wt loss

## 2016-04-29 ENCOUNTER — Encounter: Payer: Self-pay | Admitting: *Deleted

## 2016-10-14 ENCOUNTER — Encounter (INDEPENDENT_AMBULATORY_CARE_PROVIDER_SITE_OTHER): Payer: Self-pay

## 2016-10-14 ENCOUNTER — Encounter: Payer: Self-pay | Admitting: Family Medicine

## 2016-10-14 ENCOUNTER — Ambulatory Visit (INDEPENDENT_AMBULATORY_CARE_PROVIDER_SITE_OTHER): Payer: BC Managed Care – PPO | Admitting: Family Medicine

## 2016-10-14 VITALS — BP 116/78 | HR 76 | Temp 98.1°F | Ht 63.75 in | Wt 173.5 lb

## 2016-10-14 DIAGNOSIS — B360 Pityriasis versicolor: Secondary | ICD-10-CM | POA: Diagnosis not present

## 2016-10-14 MED ORDER — KETOCONAZOLE 2 % EX CREA: 1.0000 "application " | TOPICAL_CREAM | Freq: Every day | CUTANEOUS | 1 refills | Status: DC

## 2016-10-14 NOTE — Patient Instructions (Signed)
Try to keep rash area clean and dry (as possible)  Especially after work outs  Use the cream once daily to affected areas  If not improving in next 1-2 weeks or worse please let us know

## 2016-10-14 NOTE — Progress Notes (Signed)
Subjective:    Patient ID: Jillian Fleming, female    DOB: [birth year], 36 y.o.   MRN: [id number]  HPI Here for a rash   Similar occ 3-4 y ago  She went to dermatologist and was tx with anti fungal -got better   On the back of her neck Itchy  2 days  Goes into her hair  No other areas  No home treatment   No change in hair products or treatments   No tick bites/no fever  No one else in house with rash   Patient Active Problem List   Diagnosis Date Noted  . Tinea versicolor 10/14/2016  . Allergic rhinitis 04/26/2016  . Breast lump in female 12/01/2015  . Elevated blood pressure reading 04/10/2015  . 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 Food allergy 02/27/2013  . Routine general medical examination at a health care facility 02/19/2013  . Overweight 04/07/2009  . Anti-phospholipid antibody syndrome (HCC) 04/07/2009   Past Medical History:  Diagnosis Date  . History of antiphospholipid syndrome    is on asa and has to have lovenox for pregnancy  . History of blood transfusion 2005   after miscarriage  . History of miscarriage    Past Surgical History:  Procedure Laterality Date  . BIOPSY BREAST  2001  . DILATION AND CURETTAGE OF UTERUS    . HYSTEROSCOPY     surgery to remove scar tissue   Social History  Substance Use Topics  . Smoking status: Never Smoker  . Smokeless tobacco: Never Used  . Alcohol use No   Family History  Problem Relation Age of Onset  . Hypertension Mother   . Prostate cancer Father    No Known Allergies Current Outpatient Prescriptions on File Prior to Visit  Medication Sig Dispense Refill  . aspirin 81 MG tablet Take 81 mg by mouth daily.    . fluticasone (FLONASE) 50 MCG/ACT nasal spray Place 2 sprays into both nostrils daily as needed for allergies or rhinitis. 16 g 11  . ibuprofen (ADVIL,MOTRIN) 600 MG tablet Take 1 tablet (600 mg total) by mouth every 6 (six) hours as needed. 30 tablet 0  .  levonorgestrel (MIRENA) 20 MCG/24HR IUD 1 each by Intrauterine route once.     No current facility-administered medications on file prior to visit.     Review of Systems Review of Systems  Constitutional: Negative for fever, appetite change, fatigue and unexpected weight change.  Eyes: Negative for pain and visual disturbance.  Respiratory: Negative for cough and shortness of breath.   Cardiovascular: Negative for cp or palpitations    Gastrointestinal: Negative for nausea, diarrhea and constipation.  Genitourinary: Negative for urgency and frequency.  Skin: Negative for pallor and pos for itchy rash    Neurological: Negative for weakness, light-headedness, numbness and headaches.  Hematological: Negative for adenopathy. Does not bruise/bleed easily.  Psychiatric/Behavioral: Negative for dysphoric mood. The patient is not nervous/anxious.         Objective:   Physical Exam  Constitutional: She appears well-developed and well-nourished. No distress.  Well appearing   HENT:  Head: Normocephalic and atraumatic.  Mouth/Throat: Oropharynx is clear and moist.  Eyes: Pupils are equal, round, and reactive to light. Conjunctivae and EOM are normal. Right eye exhibits no discharge. Left eye exhibits no discharge. No scleral icterus.  Neck: Normal range of motion. Neck supple.  Cardiovascular: Normal rate and regular rhythm.   Pulmonary/Chest: Effort normal and breath sounds normal. She has  no wheezes.  Lymphadenopathy:    She has no cervical adenopathy.  Neurological: She is alert.  Skin: Skin is warm and dry. Rash noted.  Upper posterior shoulders and neck- oval areas of hypopigmentation scattered with slt scale  Resembles tinea versicolor  Psychiatric: She has a normal mood and affect.          Assessment & Plan:   Problem List Items Addressed This Visit      Musculoskeletal and Integument   Tinea versicolor    Pt has had it before  Px ketoconazole cream 2% for use daily  until clear  Can also use dandruff shampoo for body prn  Keep cool to avoid itch Update if not starting to improve in 1-2 wk or if worsening

## 2016-10-16 NOTE — Assessment & Plan Note (Signed)
Pt has had it before  Px ketoconazole cream 2% for use daily until clear  Can also use dandruff shampoo for body prn  Keep cool to avoid itch Update if not starting to improve in 1-2 wk or if worsening

## 2017-10-23 ENCOUNTER — Encounter: Payer: Self-pay | Admitting: Family Medicine

## 2017-10-23 ENCOUNTER — Ambulatory Visit: Payer: BC Managed Care – PPO | Admitting: Family Medicine

## 2017-10-23 VITALS — BP 122/70 | HR 86 | Temp 98.5°F | Ht 63.75 in | Wt 177.0 lb

## 2017-10-23 DIAGNOSIS — S29012A Strain of muscle and tendon of back wall of thorax, initial encounter: Secondary | ICD-10-CM | POA: Diagnosis not present

## 2017-10-23 MED ORDER — CYCLOBENZAPRINE HCL 10 MG PO TABS: 10.0000 mg | ORAL_TABLET | Freq: Every evening | ORAL | 0 refills | Status: DC | PRN

## 2017-10-23 NOTE — Progress Notes (Signed)
Subjective:    Patient ID: Jillian Fleming, female    DOB: 1980-09-12, 37 y.o.   MRN: [id number]  HPI Here for upper left back pain   Wt Readings from Last 3 Encounters:  10/23/17 177 lb (80.3 kg)  10/14/16 173 lb 8 oz (78.7 kg)  04/26/16 174 lb 8 oz (79.2 kg)   30.62 kg/m   Last night- standing in the kitchen Pain started when Jillian Fleming twisted to walk out of room-noticed it is sore  Woke up- much worse / pain in shoulder blade area / very tight   Does not radiate  Sharp /dull/ tight feeling Heat helped briefly  Did not take any otc medication   Much worse with twisting   (hurt to pick up child)   Jillian Fleming is shopping for a new mattress   Patient Active Problem List   Diagnosis Date Noted  . Rhomboid muscle strain 10/23/2017  . Tinea versicolor 10/14/2016  . Allergic rhinitis 04/26/2016  . Breast lump in female 12/01/2015  . Elevated blood pressure reading 04/10/2015  . 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 Food allergy 02/27/2013  . Routine general medical examination at a health care facility 02/19/2013  . Overweight 04/07/2009  . Anti-phospholipid antibody syndrome (HCC) 04/07/2009   Past Medical History:  Diagnosis Date  . History of antiphospholipid syndrome    is on asa and has to have lovenox for pregnancy  . History of blood transfusion 2005   after miscarriage  . History of miscarriage    Past Surgical History:  Procedure Laterality Date  . BIOPSY BREAST  2001  . DILATION AND CURETTAGE OF UTERUS    . HYSTEROSCOPY     surgery to remove scar tissue   Social History   Tobacco Use  . Smoking status: Never Smoker  . Smokeless tobacco: Never Used  Substance Use Topics  . Alcohol use: No    Alcohol/week: 0.0 standard drinks  . Drug use: No   Family History  Problem Relation Age of Onset  . Hypertension Mother   . Prostate cancer Father    No Known Allergies Current Outpatient Medications on File Prior to Visit  Medication Sig  Dispense Refill  . aspirin 81 MG tablet Take 81 mg by mouth daily.    . fluticasone (FLONASE) 50 MCG/ACT nasal spray Place 2 sprays into both nostrils daily as needed for allergies or rhinitis. 16 g 11  . ketoconazole (NIZORAL) 2 % cream Apply 1 application topically daily. To affected areas 30 g 1  . levonorgestrel (MIRENA) 20 MCG/24HR IUD 1 each by Intrauterine route once.     No current facility-administered medications on file prior to visit.     Review of Systems  Constitutional: Negative for activity change, appetite change, fatigue, fever and unexpected weight change.  HENT: Negative for congestion, ear pain, rhinorrhea, sinus pressure and sore throat.   Eyes: Negative for pain, redness and visual disturbance.  Respiratory: Negative for cough, shortness of breath and wheezing.   Cardiovascular: Negative for chest pain and palpitations.  Gastrointestinal: Negative for abdominal pain, blood in stool, constipation and diarrhea.  Endocrine: Negative for polydipsia and polyuria.  Genitourinary: Negative for dysuria, frequency and urgency.  Musculoskeletal: Positive for back pain. Negative for arthralgias, joint swelling and myalgias.  Skin: Negative for pallor and rash.  Allergic/Immunologic: Negative for environmental allergies.  Neurological: Negative for dizziness, syncope and headaches.  Hematological: Negative for adenopathy. Does not bruise/bleed easily.  Psychiatric/Behavioral: Negative for decreased  concentration and dysphoric mood. The patient is not nervous/anxious.        Objective:   Physical Exam  Constitutional: Jillian Fleming appears well-developed and well-nourished. No distress.  Well appearing   HENT:  Head: Normocephalic and atraumatic.  Mouth/Throat: Oropharynx is clear and moist.  Eyes: Pupils are equal, round, and reactive to light. Conjunctivae and EOM are normal. No scleral icterus.  Neck: Normal range of motion. Neck supple.  No cervical tenderness Nl rom     Cardiovascular: Normal rate, regular rhythm and normal heart sounds.  Pulmonary/Chest: Effort normal and breath sounds normal. No respiratory distress. Jillian Fleming has no wheezes. Jillian Fleming exhibits no tenderness.  Musculoskeletal:       Thoracic back: Jillian Fleming exhibits decreased range of motion, tenderness and spasm. Jillian Fleming exhibits no edema and no deformity.  Spasm and tenderness medial to L scapula  No rash/swelling or skin color change Inc pain with twisting or lateral bend both directions No crepitus  No spinous process tenderness   Lymphadenopathy:    Jillian Fleming has no cervical adenopathy.  Neurological: Jillian Fleming displays no atrophy and no tremor. No sensory deficit. Jillian Fleming exhibits normal muscle tone.  Skin: Skin is warm and dry. No rash noted. No erythema.  Psychiatric: Jillian Fleming has a normal mood and affect.          Assessment & Plan:   Problem List Items Addressed This Visit      Musculoskeletal and Integument   Rhomboid muscle strain - Primary    On L -occurred after twisting and lifting  Suspect spasm -worse after inactivity  Demonstrated stretch-was helpful  Aleve bid with food  Flexeril for bedtime as needed Good mattress/pillow -stressed importance as well  Update if not starting to improve in a week or if worsening  (would consider PT)  Meds ordered this encounter  Medications  . cyclobenzaprine (FLEXERIL) 10 MG tablet    Sig: Take 1 tablet (10 mg total) by mouth at bedtime as needed for muscle spasms (back pain).    Dispense:  30 tablet    Refill:  0

## 2017-10-23 NOTE — Patient Instructions (Addendum)
I think you have muscle spasm (rhomboid)  Do the stretch we reviewed Use heat 10 minutes at a time  Aleve 1-2 pills with food up to every 12 hours   Flexeril (muscle relaxer) at bedtime   Massage is good as well

## 2017-10-23 NOTE — Assessment & Plan Note (Addendum)
On L -occurred after twisting and lifting  Suspect spasm -worse after inactivity  Demonstrated stretch-was helpful  Aleve bid with food  Flexeril for bedtime as needed Good mattress/pillow -stressed importance as well  Update if not starting to improve in a week or if worsening  (would consider PT)  Meds ordered this encounter  Medications  . cyclobenzaprine (FLEXERIL) 10 MG tablet    Sig: Take 1 tablet (10 mg total) by mouth at bedtime as needed for muscle spasms (back pain).    Dispense:  30 tablet    Refill:  0

## 2018-02-28 ENCOUNTER — Telehealth: Payer: Self-pay

## 2018-02-28 MED ORDER — OSELTAMIVIR PHOSPHATE 75 MG PO CAPS: 75.0000 mg | ORAL_CAPSULE | Freq: Every day | ORAL | 0 refills | Status: DC

## 2018-02-28 NOTE — Telephone Encounter (Signed)
Pt's 37 year old was seen today and tested positive for Flu B. Pt would like a rx for Tamiflu. [facility].

## 2018-02-28 NOTE — Addendum Note (Signed)
Addended by: Roxy MannsWER, Akiyah Eppolito A on: 02/28/2018 12:45 PM   Modules accepted: Orders

## 2018-03-08 ENCOUNTER — Ambulatory Visit: Payer: BC Managed Care – PPO | Admitting: Family Medicine

## 2018-03-08 ENCOUNTER — Encounter: Payer: Self-pay | Admitting: Family Medicine

## 2018-03-08 VITALS — BP 122/78 | HR 86 | Temp 98.2°F | Ht 63.75 in | Wt 180.5 lb

## 2018-03-08 DIAGNOSIS — J111 Influenza due to unidentified influenza virus with other respiratory manifestations: Secondary | ICD-10-CM | POA: Diagnosis not present

## 2018-03-08 DIAGNOSIS — D6861 Antiphospholipid syndrome: Secondary | ICD-10-CM | POA: Diagnosis not present

## 2018-03-08 MED ORDER — GUAIFENESIN-CODEINE 100-10 MG/5ML PO SYRP: 5.0000 mL | ORAL_SOLUTION | Freq: Three times a day (TID) | ORAL | 0 refills | Status: DC | PRN

## 2018-03-08 MED ORDER — BENZONATATE 200 MG PO CAPS: 200.0000 mg | ORAL_CAPSULE | Freq: Three times a day (TID) | ORAL | 1 refills | Status: DC | PRN

## 2018-03-08 MED ORDER — OSELTAMIVIR PHOSPHATE 75 MG PO CAPS: 75.0000 mg | ORAL_CAPSULE | Freq: Two times a day (BID) | ORAL | 0 refills | Status: DC

## 2018-03-08 NOTE — Assessment & Plan Note (Signed)
No changes in status.

## 2018-03-08 NOTE — Progress Notes (Signed)
Subjective:    Patient ID: Jillian Fleming, female    DOB: 16-Jan-1981, 37 y.o.   MRN: [id number]  HPI  Here for cough/congestion and headache  Baseline- sinus pressure from allergies   Theses symptoms started 12/23  Out shopping- felt terrible / hot and went to bed  Next day-wore a mask during xmas (quarantined herself) Stayed in bed all day yesterday  Fever all day yesterday - chills/sweats with headache  Body aches- mild if any  Coughing today - no production (sounds junky)  A lot of head pressure   Temp 103 yesterday   Ears hurt  No throat pain  No rash   Otc:  Ibuprofen  nyquil  Alka selzer cold  Benadryl once   Daughter had the flu  She is taking proph tamiflu   No fever today  Temp: 98.2 F (36.8 C)  Pulse ox 98%  Patient Active Problem List   Diagnosis Date Noted  . Influenza 03/08/2018  . Rhomboid muscle strain 10/23/2017  . Tinea versicolor 10/14/2016  . Allergic rhinitis 04/26/2016  . Breast lump in female 12/01/2015  . Elevated blood pressure reading 04/10/2015  . 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 Food allergy 02/27/2013  . Routine general medical examination at a health care facility 02/19/2013  . Overweight 04/07/2009  . Anti-phospholipid antibody syndrome (HCC) 04/07/2009   Past Medical History:  Diagnosis Date  . History of antiphospholipid syndrome    is on asa and has to have lovenox for pregnancy  . History of blood transfusion 2005   after miscarriage  . History of miscarriage    Past Surgical History:  Procedure Laterality Date  . BIOPSY BREAST  2001  . DILATION AND CURETTAGE OF UTERUS    . HYSTEROSCOPY     surgery to remove scar tissue   Social History   Tobacco Use  . Smoking status: Never Smoker  . Smokeless tobacco: Never Used  Substance Use Topics  . Alcohol use: No    Alcohol/week: 0.0 standard drinks  . Drug use: No   Family History  Problem Relation Age of Onset  . Hypertension  Mother   . Prostate cancer Father    No Known Allergies Current Outpatient Medications on File Prior to Visit  Medication Sig Dispense Refill  . aspirin 81 MG tablet Take 81 mg by mouth daily.    . cyclobenzaprine (FLEXERIL) 10 MG tablet Take 1 tablet (10 mg total) by mouth at bedtime as needed for muscle spasms (back pain). 30 tablet 0  . fluticasone (FLONASE) 50 MCG/ACT nasal spray Place 2 sprays into both nostrils daily as needed for allergies or rhinitis. 16 g 11  . ketoconazole (NIZORAL) 2 % cream Apply 1 application topically daily. To affected areas 30 g 1  . levonorgestrel (MIRENA) 20 MCG/24HR IUD 1 each by Intrauterine route once.     No current facility-administered medications on file prior to visit.     Review of Systems  Constitutional: Positive for appetite change and fatigue. Negative for fever.  HENT: Positive for congestion, postnasal drip, rhinorrhea, sinus pressure, sneezing and sore throat. Negative for ear pain.   Eyes: Negative for pain and discharge.  Respiratory: Positive for cough. Negative for chest tightness, shortness of breath, wheezing and stridor.   Cardiovascular: Negative for chest pain.  Gastrointestinal: Negative for diarrhea, nausea and vomiting.  Genitourinary: Negative for frequency, hematuria and urgency.  Musculoskeletal: Negative for arthralgias and myalgias.  Skin: Negative for rash.  Neurological: Positive for headaches. Negative for dizziness, weakness and light-headedness.  Psychiatric/Behavioral: Negative for confusion and dysphoric mood.       Objective:   Physical Exam Constitutional:      General: She is not in acute distress.    Appearance: Normal appearance. She is well-developed. She is obese. She is not ill-appearing, toxic-appearing or diaphoretic.     Comments: Fatigued   HENT:     Head: Normocephalic and atraumatic.     Comments: Nares are injected and congested    Mild frontal sinus tenderness    Right Ear: Tympanic  membrane, ear canal and external ear normal.     Left Ear: Tympanic membrane, ear canal and external ear normal.     Nose: Congestion and rhinorrhea present.     Mouth/Throat:     Mouth: Mucous membranes are moist.     Pharynx: Oropharynx is clear. No oropharyngeal exudate or posterior oropharyngeal erythema.     Comments: Clear pnd  Eyes:     General:        Right eye: No discharge.        Left eye: No discharge.     Conjunctiva/sclera: Conjunctivae normal.     Pupils: Pupils are equal, round, and reactive to light.  Neck:     Musculoskeletal: Normal range of motion and neck supple.  Cardiovascular:     Rate and Rhythm: Normal rate.     Heart sounds: Normal heart sounds.  Pulmonary:     Effort: Pulmonary effort is normal. No respiratory distress.     Breath sounds: Normal breath sounds. No stridor. No wheezing, rhonchi or rales.     Comments: Good air exch No rales/rhonchi No wheeze even on forced exp Chest:     Chest wall: No tenderness.  Lymphadenopathy:     Cervical: No cervical adenopathy.  Skin:    General: Skin is warm and dry.     Capillary Refill: Capillary refill takes less than 2 seconds.     Findings: No rash.  Neurological:     Mental Status: She is alert.     Cranial Nerves: No cranial nerve deficit.  Psychiatric:        Mood and Affect: Mood normal.           Assessment & Plan:   Problem List Items Addressed This Visit      Respiratory   Influenza - Primary    After exp to daughter with it (already on proph tamiflu) Will inc dose to bid for 5 d for active influenza Fluids/rest Ibuprofen -fever Tessalon prn cough  Robitussin ac with caution of sedation (when not working or driving) Update if not starting to improve in a week or if worsening         Relevant Medications   oseltamivir (TAMIFLU) 75 MG capsule

## 2018-03-08 NOTE — Assessment & Plan Note (Signed)
After exp to daughter with it (already on proph tamiflu) Will inc dose to bid for 5 d for active influenza Fluids/rest Ibuprofen -fever Tessalon prn cough  Robitussin ac with caution of sedation (when not working or driving) Update if not starting to improve in a week or if worsening

## 2018-03-08 NOTE — Patient Instructions (Addendum)
Drink lots of fluids and rest Take the tamiflu as directed  Try tessalon for cough three times daily  Add the codeine cough syrup when not working or driving   Nasal saline spray helps Continue ibuprofen for fever   Update if not starting to improve in a week or if worsening

## 2018-11-09 ENCOUNTER — Ambulatory Visit: Payer: BC Managed Care – PPO

## 2018-12-14 ENCOUNTER — Encounter: Payer: Self-pay | Admitting: Family Medicine

## 2018-12-14 NOTE — Telephone Encounter (Signed)
Message about this was sent earlier to Dr Darnell Level and Dr Glori Bickers as Juluis Rainier in patient's chart. Raynelle Chary

## 2018-12-15 ENCOUNTER — Encounter: Payer: Self-pay | Admitting: Family Medicine

## 2019-08-04 ENCOUNTER — Telehealth: Payer: Self-pay | Admitting: Family Medicine

## 2019-08-04 DIAGNOSIS — Z0000 Encounter for general adult medical examination without abnormal findings: Secondary | ICD-10-CM

## 2019-08-04 NOTE — Telephone Encounter (Signed)
-----   Message from Aquilla Solian, RT sent at 07/23/2019  2:36 PM EDT ----- Regarding: Lab Orders for Tuesday 5.25.2021 Please place lab orders for Tuesday 5.25.2021, office visit for physical on Friday 5.28.2021 Thank you, Jones Bales RT(R)

## 2019-08-06 ENCOUNTER — Other Ambulatory Visit (INDEPENDENT_AMBULATORY_CARE_PROVIDER_SITE_OTHER): Payer: BC Managed Care – PPO

## 2019-08-06 DIAGNOSIS — Z0000 Encounter for general adult medical examination without abnormal findings: Secondary | ICD-10-CM

## 2019-08-06 LAB — COMPREHENSIVE METABOLIC PANEL
ALT: 14 U/L (ref 0–35)
AST: 16 U/L (ref 0–37)
Albumin: 4.5 g/dL (ref 3.5–5.2)
Alkaline Phosphatase: 57 U/L (ref 39–117)
BUN: 19 mg/dL (ref 6–23)
CO2: 27 mEq/L (ref 19–32)
Calcium: 9.1 mg/dL (ref 8.4–10.5)
Chloride: 104 mEq/L (ref 96–112)
Creatinine, Ser: 1.05 mg/dL (ref 0.40–1.20)
GFR: 70.23 mL/min (ref >60.00)
Glucose, Bld: 95 mg/dL (ref 70–99)
Potassium: 3.7 mEq/L (ref 3.5–5.1)
Sodium: 138 mEq/L (ref 135–145)
Total Bilirubin: 0.3 mg/dL (ref 0.2–1.2)
Total Protein: 7.3 g/dL (ref 6.0–8.3)

## 2019-08-06 LAB — TSH: TSH: 1.87 u[IU]/mL (ref 0.35–4.50)

## 2019-08-06 LAB — CBC WITH DIFFERENTIAL/PLATELET
Basophils Absolute: 0.1 10*3/uL (ref 0.0–0.1)
Basophils Relative: 1.2 % (ref 0.0–3.0)
Eosinophils Absolute: 0.3 10*3/uL (ref 0.0–0.7)
Eosinophils Relative: 5.6 % — ABNORMAL HIGH (ref 0.0–5.0)
HCT: 41.1 % (ref 36.0–46.0)
Hemoglobin: 13.7 g/dL (ref 12.0–15.0)
Lymphocytes Relative: 24 % (ref 12.0–46.0)
Lymphs Abs: 1.5 10*3/uL (ref 0.7–4.0)
MCHC: 33.4 g/dL (ref 30.0–36.0)
MCV: 89.5 fl (ref 78.0–100.0)
Monocytes Absolute: 0.4 10*3/uL (ref 0.1–1.0)
Monocytes Relative: 6.8 % (ref 3.0–12.0)
Neutro Abs: 3.8 10*3/uL (ref 1.4–7.7)
Neutrophils Relative %: 62.4 % (ref 43.0–77.0)
Platelets: 261 10*3/uL (ref 150.0–400.0)
RBC: 4.59 Mil/uL (ref 3.87–5.11)
RDW: 12.4 % (ref 11.5–15.5)
WBC: 6 10*3/uL (ref 4.0–10.5)

## 2019-08-06 LAB — LIPID PANEL
Cholesterol: 113 mg/dL (ref 0–200)
HDL: 35.5 mg/dL — ABNORMAL LOW (ref >39.00)
LDL Cholesterol: 66 mg/dL (ref 0–99)
NonHDL: 77.84
Total CHOL/HDL Ratio: 3
Triglycerides: 60 mg/dL (ref 0.0–149.0)
VLDL: 12 mg/dL (ref 0.0–40.0)

## 2019-08-09 ENCOUNTER — Ambulatory Visit (INDEPENDENT_AMBULATORY_CARE_PROVIDER_SITE_OTHER): Payer: BC Managed Care – PPO | Admitting: Family Medicine

## 2019-08-09 ENCOUNTER — Other Ambulatory Visit: Payer: Self-pay

## 2019-08-09 ENCOUNTER — Encounter: Payer: Self-pay | Admitting: Family Medicine

## 2019-08-09 VITALS — BP 130/88 | HR 80 | Temp 97.1°F | Ht 64.0 in | Wt 179.4 lb

## 2019-08-09 DIAGNOSIS — R030 Elevated blood-pressure reading, without diagnosis of hypertension: Secondary | ICD-10-CM

## 2019-08-09 DIAGNOSIS — D6861 Antiphospholipid syndrome: Secondary | ICD-10-CM

## 2019-08-09 DIAGNOSIS — Z0000 Encounter for general adult medical examination without abnormal findings: Secondary | ICD-10-CM | POA: Diagnosis not present

## 2019-08-09 DIAGNOSIS — E669 Obesity, unspecified: Secondary | ICD-10-CM | POA: Diagnosis not present

## 2019-08-09 NOTE — Patient Instructions (Addendum)
Follow up with gyn for IUD and pap/exam  When you get your screening mammograms- ask them to send [place] a copy of it -thanks !   Exercise- plan on 30 minutes or more per day  Work with your nutritionist    BP is better on the 2nd check  BP: 130/88   Let's watch it  Work on diet and exercise

## 2019-08-09 NOTE — Progress Notes (Signed)
Subjective:    Patient ID: Jillian Fleming, female    DOB: [birth year], 39 y.o.   MRN: [id number]  This visit occurred during the SARS-CoV-2 public health emergency.  Safety protocols were in place, including screening questions prior to the visit, additional usage of staff PPE, and extensive cleaning of exam room while observing appropriate contact time as indicated for disinfecting solutions.    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 Has elliptical  Loves to be outside as well   Doing well overall   covid status -immunized with pfizer imm   Pap 12/17 -nl at gyn  Has not had one since then  Dr Leward Quan at [facility]   Had an IUD  Needs a new one  Periods are quite severe - heavy  Now gets headaches with her period also    Flu shot Tdap 8/16   Mammogram -goes to [facility] for that  Was getting them for fibrocystic breasts  Self breast exam -no new lumps or changes (lumpy in general)   Mood -doing ok given the tough year  Overall fine  Getting back to going on campus for work soon- a little anxiety provoking but excited  Kids are doing well   BP is elevated BP Readings from Last 3 Encounters:  08/09/19 140/90  03/08/18 122/78  10/23/17 122/70   Pulse Readings from Last 3 Encounters:  08/09/19 80  03/08/18 86  10/23/17 86    Cholesterol Lab Results  Component Value Date   CHOL 113 08/06/2019   CHOL 106 04/26/2016   CHOL 112 02/27/2013   Lab Results  Component Value Date   HDL 35.50 (L) 08/06/2019   HDL 37.00 (L) 04/26/2016   HDL 39.10 02/27/2013   Lab Results  Component Value Date   LDLCALC 66 08/06/2019   LDLCALC 59 04/26/2016   [place] 66 02/27/2013   Lab Results    Component Value Date   TRIG 60.0 08/06/2019   TRIG 49.0 04/26/2016   TRIG 37.0 02/27/2013   Lab Results  Component Value Date   CHOLHDL 3 08/06/2019   CHOLHDL 3 04/26/2016   CHOLHDL 3 02/27/2013   No results found for: LDLDIRECT Needs to raise HDL   Patient Active Problem List   Diagnosis Date Noted  . Obesity (BMI 30-39.9) 08/09/2019  . Rhomboid muscle strain 10/23/2017  . Tinea versicolor 10/14/2016  . Allergic rhinitis 04/26/2016  . Breast lump in female 12/01/2015  . Elevated blood pressure reading 04/10/2015  . 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 Food allergy 02/27/2013  . Routine general medical examination at a health care facility 02/19/2013  . Overweight 04/07/2009  . Anti-phospholipid antibody syndrome ([place]) 04/07/2009   Past Medical History:  Diagnosis Date  . History of antiphospholipid syndrome    is on asa and has to have lovenox for pregnancy  . History of blood transfusion 2005   after miscarriage  . History of miscarriage    Past Surgical History:  Procedure Laterality Date  . BIOPSY BREAST  2001  . DILATION AND CURETTAGE OF UTERUS    . HYSTEROSCOPY     surgery to remove scar  tissue   Social History   Tobacco Use  . Smoking status: Never Smoker  . Smokeless tobacco: Never Used  Substance Use Topics  . Alcohol use: No    Alcohol/week: 0.0 standard drinks  . Drug use: No   Family History  Problem Relation Age of Onset  . Hypertension Mother   . Prostate cancer Father    No Known Allergies Current Outpatient Medications on File Prior to Visit  Medication Sig Dispense Refill  . aspirin 81 MG tablet Take 81 mg by mouth daily.    . fluticasone (FLONASE) 50 MCG/ACT nasal spray Place 2 sprays into both nostrils daily as needed for allergies or rhinitis. 16 g 11   No current facility-administered medications on file prior to visit.    Review of Systems  Constitutional: Negative for activity change, appetite change,  fatigue, fever and unexpected weight change.  HENT: Negative for congestion, ear pain, rhinorrhea, sinus pressure and sore throat.   Eyes: Negative for pain, redness and visual disturbance.  Respiratory: Negative for cough, shortness of breath and wheezing.   Cardiovascular: Negative for chest pain and palpitations.  Gastrointestinal: Negative for abdominal pain, blood in stool, constipation and diarrhea.  Endocrine: Negative for polydipsia and polyuria.  Genitourinary: Negative for dysuria, frequency and urgency.  Musculoskeletal: Negative for arthralgias, back pain and myalgias.  Skin: Negative for pallor and rash.  Allergic/Immunologic: Negative for environmental allergies.  Neurological: Negative for dizziness, syncope and headaches.  Hematological: Negative for adenopathy. Does not bruise/bleed easily.  Psychiatric/Behavioral: Negative for decreased concentration and dysphoric mood. The patient is not nervous/anxious.        Objective:   Physical Exam Constitutional:      General: She is not in acute distress.    Appearance: Normal appearance. She is well-developed. She is obese. She is not ill-appearing or diaphoretic.  HENT:     Head: Normocephalic and atraumatic.     Right Ear: Tympanic membrane, ear canal and external ear normal.     Left Ear: Tympanic membrane, ear canal and external ear normal.     Nose: Nose normal. No congestion.     Mouth/Throat:     Mouth: Mucous membranes are moist.     Pharynx: Oropharynx is clear. No posterior oropharyngeal erythema.  Eyes:     General: No scleral icterus.    Extraocular Movements: Extraocular movements intact.     Conjunctiva/sclera: Conjunctivae normal.     Pupils: Pupils are equal, round, and reactive to light.  Neck:     Thyroid: No thyromegaly.     Vascular: No carotid bruit or JVD.  Cardiovascular:     Rate and Rhythm: Normal rate and regular rhythm.     Pulses: Normal pulses.     Heart sounds: Normal heart sounds. No  gallop.   Pulmonary:     Effort: Pulmonary effort is normal. No respiratory distress.     Breath sounds: Normal breath sounds. No wheezing.     Comments: Good air exch Chest:     Chest wall: No tenderness.  Abdominal:     General: Bowel sounds are normal. There is no distension or abdominal bruit.     Palpations: Abdomen is soft. There is no mass.     Tenderness: There is no abdominal tenderness.     Hernia: No hernia is present.  Genitourinary:    Comments: Breast exam: No mass, nodules, thickening, tenderness, bulging, retraction, inflamation, nipple discharge or skin changes noted.  No axillary or clavicular LA.  Musculoskeletal:        General: No tenderness. Normal range of motion.     Cervical back: Normal range of motion and neck supple. No rigidity. No muscular tenderness.     Right lower leg: No edema.     Left lower leg: No edema.  Lymphadenopathy:     Cervical: No cervical adenopathy.  Skin:    General: Skin is warm and dry.     Coloration: Skin is not pale.     Findings: No erythema or rash.  Neurological:     Mental Status: She is alert. Mental status is at baseline.     Cranial Nerves: No cranial nerve deficit.     Motor: No abnormal muscle tone.     Coordination: Coordination normal.     Gait: Gait normal.     Deep Tendon Reflexes: Reflexes are normal and symmetric. Reflexes normal.  Psychiatric:        Mood and Affect: Mood normal.        Cognition and Memory: Cognition and memory normal.     Comments: Pleasant            Assessment & Plan:   Problem List Items Addressed This Visit      Hematopoietic and Hemostatic   Anti-phospholipid antibody syndrome (HCC)    No clinical changes  Takes 81 mg asa daily         Other   Routine general medical examination at a health care facility - Primary    Reviewed health habits including diet and exercise and skin cancer prevention Reviewed appropriate screening tests for age  Also reviewed health mt  list, fam hx and immunization status , as well as social and family history    See HPI Labs reviewed  Disc plan to begin exercise  Pt will f/u with gyn for annual exam/ get IUD She is immunized for covid       Elevated blood pressure reading    Improved on 2nd check  BP: 130/88   Disc ways to prevent HTN with lifestyle change  DASH eating and exercise       Obesity (BMI 30-39.9)    Discussed how this problem influences overall health and the risks it imposes  Reviewed plan for weight loss with lower calorie diet (via better food choices and also portion control or program like weight watchers) and exercise building up to or more than 30 minutes 5 days per week including some aerobic activity

## 2019-08-11 NOTE — Assessment & Plan Note (Signed)
No clinical changes  Takes 81 mg asa daily

## 2019-08-11 NOTE — Assessment & Plan Note (Signed)
Reviewed health habits including diet and exercise and skin cancer prevention Reviewed appropriate screening tests for age  Also reviewed health mt list, fam hx and immunization status , as well as social and family history    See HPI Labs reviewed  Disc plan to begin exercise  Pt will f/u with gyn for annual exam/ get IUD She is immunized for covid

## 2019-08-11 NOTE — Assessment & Plan Note (Signed)
Discussed how this problem influences overall health and the risks it imposes  Reviewed plan for weight loss with lower calorie diet (via better food choices and also portion control or program like weight watchers) and exercise building up to or more than 30 minutes 5 days per week including some aerobic activity    

## 2019-08-11 NOTE — Assessment & Plan Note (Signed)
Improved on 2nd check  BP: 130/88   Disc ways to prevent HTN with lifestyle change  DASH eating and exercise

## 2019-10-23 ENCOUNTER — Encounter: Payer: Self-pay | Admitting: Family Medicine

## 2019-11-05 ENCOUNTER — Encounter: Payer: Self-pay | Admitting: Family Medicine

## 2020-01-10 LAB — HM PAP SMEAR: HM Pap smear: NORMAL

## 2020-04-24 ENCOUNTER — Ambulatory Visit (INDEPENDENT_AMBULATORY_CARE_PROVIDER_SITE_OTHER): Payer: BC Managed Care – PPO | Admitting: Family Medicine

## 2020-04-24 ENCOUNTER — Encounter: Payer: Self-pay | Admitting: Family Medicine

## 2020-04-24 ENCOUNTER — Other Ambulatory Visit: Payer: Self-pay

## 2020-04-24 ENCOUNTER — Telehealth: Payer: Self-pay

## 2020-04-24 DIAGNOSIS — U071 COVID-19: Secondary | ICD-10-CM | POA: Diagnosis not present

## 2020-04-24 NOTE — Progress Notes (Signed)
Virtual Visit via Video Note  I connected with Jillian Fleming on 04/24/20 at  4:30 PM EST by a video enabled telemedicine application and verified that I am speaking with the correct person using two identifiers.  Location: Patient: home Provider: office   I discussed the limitations of evaluation and management by telemedicine and the availability of in person appointments. The patient expressed understanding and agreed to proceed.  Parties involved in encounter  Patient: Jillian Fleming   Provider:  Roxy Manns MD   History of Present Illness: Pt presents with symptoms of covid 27 and is pregnant   Caught it from her daughter   To start with she has chronic congestion with her pregnancy -so that is her norm Got a headache and very dry throat last night  Notes more labored breathing when she moves around  Not much cough at all  No phlegm at all  Nasal d/c is clear  Fever - this am was 100.3 , took some tylenol and it helped  100.0 this afternoon , then 100.4  Feels cold and hot  No body aches for the most part   (back hurt a little from pregnancy)   No n/v/d  No loss of taste or smell   Pregnant- 25 weeks  Everything has gone well , baby moves a lot  Has not contacted obgyn yet    home covid test pos today   Immunized pfizer in march -no booster yet   Patient Active Problem List   Diagnosis Date Noted  . COVID-19 04/24/2020  . Obesity (BMI 30-39.9) 08/09/2019  . Rhomboid muscle strain 10/23/2017  . Tinea versicolor 10/14/2016  . Allergic rhinitis 04/26/2016  . Breast lump in female 12/01/2015  . Elevated blood pressure reading 04/10/2015  . 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 Food allergy 02/27/2013  . Routine general medical examination at a health care facility 02/19/2013  . Overweight 04/07/2009  . Anti-phospholipid antibody syndrome (HCC) 04/07/2009   Past Medical History:  Diagnosis Date  . History of antiphospholipid  syndrome    is on asa and has to have lovenox for pregnancy  . History of blood transfusion 2005   after miscarriage  . History of miscarriage    Past Surgical History:  Procedure Laterality Date  . BIOPSY BREAST  2001  . DILATION AND CURETTAGE OF UTERUS    . HYSTEROSCOPY     surgery to remove scar tissue   Social History   Tobacco Use  . Smoking status: Never Smoker  . Smokeless tobacco: Never Used  Substance Use Topics  . Alcohol use: No    Alcohol/week: 0.0 standard drinks  . Drug use: No   Family History  Problem Relation Age of Onset  . Hypertension Mother   . Prostate cancer Father    No Known Allergies Current Outpatient Medications on File Prior to Visit  Medication Sig Dispense Refill  . aspirin 81 MG tablet Take 81 mg by mouth daily.    Marland Kitchen enoxaparin (LOVENOX) 40 MG/0.4ML injection Inject into the skin daily.    . fluticasone (FLONASE) 50 MCG/ACT nasal spray Place 2 sprays into both nostrils daily as needed for allergies or rhinitis. 16 g 11   No current facility-administered medications on file prior to visit.   Review of Systems  Constitutional: Positive for fever and malaise/fatigue. Negative for chills.  HENT: Positive for congestion. Negative for ear pain, sinus pain and sore throat.   Eyes: Negative for blurred vision,  discharge and redness.  Respiratory: Positive for cough. Negative for sputum production, shortness of breath, wheezing and stridor.   Cardiovascular: Negative for chest pain, palpitations and leg swelling.  Gastrointestinal: Negative for abdominal pain, diarrhea, nausea and vomiting.  Musculoskeletal: Negative for myalgias.  Skin: Negative for rash.  Neurological: Positive for headaches. Negative for dizziness.    Observations/Objective: Patient appears well, in no distress (is fatigued)  Weight is baseline  No facial swelling or asymmetry Clears throat frequently , sounds nasally congested Hoarse voice  No obvious tremor or mobility  impairment Moving neck and UEs normally Able to hear the call well  No cough or shortness of breath during interview  Talkative and mentally sharp with no cognitive changes No skin changes on face or neck , no rash or pallor Affect is normal    Assessment and Plan: Problem List Items Addressed This Visit      Other   COVID-19    In immunized (no booster) [redacted] wk pregnant pt  Fever and congestion , slt chest symptoms Ref made for covid tx/ discussed myoclonal abx infusion (she is open to this)  Fluids/rest sympt care Disc ER parameters  Watching for wheeze, sob or fever that will not go down Also dehydration (n/v)  Will update and monitor closely       Relevant Orders   Ambulatory referral for Covid Treatment       Follow Up Instructions: Continue fluids and rest and isolation  Treat fever/ pain with tylenol  Nasal saline for congestion  I sent a referral for covid treatment (suspect antibody infusion would help)  Please call and update you obgyn  If short or breath, chest pain, wheezing or other severe symptoms please go to ER Also if cramping or bleeding  Please stay well hydrated    I discussed the assessment and treatment plan with the patient. The patient was provided an opportunity to ask questions and all were answered. The patient agreed with the plan and demonstrated an understanding of the instructions.   The patient was advised to call back or seek an in-person evaluation if the symptoms worsen or if the condition fails to improve as anticipated.     Roxy Manns, MD

## 2020-04-24 NOTE — Telephone Encounter (Signed)
6 Months pregnant. Started with worsening nasal congestion yesterday, headache this morning, Fever 100.0 at 2pm, took Tylenol and at 3pm Fever 100.4 Just did home Covid test and tested positive. Vaccinated with [employer]. Not boosted. Has not called GYN. Feels short of breath when she gets up to walk around. Asking what she should do.

## 2020-04-25 ENCOUNTER — Telehealth: Payer: Self-pay | Admitting: Unknown Physician Specialty

## 2020-04-25 ENCOUNTER — Other Ambulatory Visit: Payer: Self-pay | Admitting: Unknown Physician Specialty

## 2020-04-25 ENCOUNTER — Encounter: Payer: Self-pay | Admitting: Unknown Physician Specialty

## 2020-04-25 DIAGNOSIS — E669 Obesity, unspecified: Secondary | ICD-10-CM

## 2020-04-25 DIAGNOSIS — U071 COVID-19: Secondary | ICD-10-CM

## 2020-04-25 NOTE — Telephone Encounter (Signed)
I connected by phone with Jillian Fleming on 04/25/2020 at 3:57 PM to discuss the potential use of a new treatment for mild to moderate COVID-19 viral infection in non-hospitalized patients.  This patient is a 40 y.o. female that meets the FDA criteria for Emergency Use Authorization of COVID monoclonal antibody sotrovimab.  Has a (+) direct SARS-CoV-2 viral test result  Has mild or moderate COVID-19   Is NOT hospitalized due to COVID-19  Is within 10 days of symptom onset  Has at least one of the high risk factor(s) for progression to severe COVID-19 and/or hospitalization as defined in EUA.  Specific high risk criteria : Pregnancy   I have spoken and communicated the following to the patient or parent/caregiver regarding COVID monoclonal antibody treatment:  1. FDA has authorized the emergency use for the treatment of mild to moderate COVID-19 in adults and pediatric patients with positive results of direct SARS-CoV-2 viral testing who are 40 years of age and older weighing at least 40 kg, and who are at high risk for progressing to severe COVID-19 and/or hospitalization.  2. The significant known and potential risks and benefits of COVID monoclonal antibody, and the extent to which such potential risks and benefits are unknown.  3. Information on available alternative treatments and the risks and benefits of those alternatives, including clinical trials.  4. Patients treated with COVID monoclonal antibody should continue to self-isolate and use infection control measures (e.g., wear mask, isolate, social distance, avoid sharing personal items, clean and disinfect "high touch" surfaces, and frequent handwashing) according to CDC guidelines.   5. The patient or parent/caregiver has the option to accept or refuse COVID monoclonal antibody treatment.  After reviewing this information with the patient, the patient has agreed to receive one of the available covid 19 monoclonal antibodies  and will be provided an appropriate fact sheet prior to infusion. Gabriel Cirri, NP 04/25/2020 3:57 PM  Sx onset 2/10

## 2020-04-26 ENCOUNTER — Encounter: Payer: Self-pay | Admitting: Family Medicine

## 2020-04-26 NOTE — Patient Instructions (Signed)
Continue fluids and rest and isolation  Treat fever/ pain with tylenol  Nasal saline for congestion  I sent a referral for covid treatment (suspect antibody infusion would help)  Please call and update you obgyn  If short or breath, chest pain, wheezing or other severe symptoms please go to ER Also if cramping or bleeding  Please stay well hydrated

## 2020-04-26 NOTE — Assessment & Plan Note (Signed)
In immunized (no booster) [redacted] wk pregnant pt  Fever and congestion , slt chest symptoms Ref made for covid tx/ discussed myoclonal abx infusion (she is open to this)  Fluids/rest sympt care Disc ER parameters  Watching for wheeze, sob or fever that will not go down Also dehydration (n/v)  Will update and monitor closely

## 2020-04-26 NOTE — Telephone Encounter (Signed)
Seen for virtual visit. 

## 2020-04-27 ENCOUNTER — Ambulatory Visit (HOSPITAL_COMMUNITY)
Admission: EM | Admit: 2020-04-27 | Discharge: 2020-04-27 | Disposition: A | Discharge status: Home / Self Care | Payer: BC Managed Care – PPO | Attending: Internal Medicine | Admitting: Internal Medicine

## 2020-04-27 ENCOUNTER — Other Ambulatory Visit: Payer: Self-pay

## 2020-04-27 ENCOUNTER — Telehealth: Payer: Self-pay

## 2020-04-27 ENCOUNTER — Encounter (HOSPITAL_COMMUNITY): Payer: Self-pay

## 2020-04-27 ENCOUNTER — Ambulatory Visit (HOSPITAL_COMMUNITY)
Admission: RE | Admit: 2020-04-27 | Discharge: 2020-04-27 | Disposition: A | Discharge status: Home / Self Care | Payer: BC Managed Care – PPO | Source: Ambulatory Visit | Attending: Pulmonary Disease | Admitting: Pulmonary Disease

## 2020-04-27 DIAGNOSIS — U071 COVID-19: Secondary | ICD-10-CM | POA: Insufficient documentation

## 2020-04-27 DIAGNOSIS — Z7982 Long term (current) use of aspirin: Secondary | ICD-10-CM | POA: Insufficient documentation

## 2020-04-27 DIAGNOSIS — O99512 Diseases of the respiratory system complicating pregnancy, second trimester: Secondary | ICD-10-CM | POA: Insufficient documentation

## 2020-04-27 DIAGNOSIS — Z7901 Long term (current) use of anticoagulants: Secondary | ICD-10-CM | POA: Diagnosis not present

## 2020-04-27 DIAGNOSIS — Z3A25 25 weeks gestation of pregnancy: Secondary | ICD-10-CM | POA: Insufficient documentation

## 2020-04-27 DIAGNOSIS — E669 Obesity, unspecified: Secondary | ICD-10-CM | POA: Insufficient documentation

## 2020-04-27 DIAGNOSIS — O98512 Other viral diseases complicating pregnancy, second trimester: Secondary | ICD-10-CM | POA: Insufficient documentation

## 2020-04-27 DIAGNOSIS — J029 Acute pharyngitis, unspecified: Secondary | ICD-10-CM | POA: Diagnosis not present

## 2020-04-27 LAB — POCT RAPID STREP A, ED / UC: Streptococcus, Group A Screen (Direct): NEGATIVE

## 2020-04-27 MED ORDER — EPINEPHRINE 0.3 MG/0.3ML IJ SOAJ: 0.3000 mg | Freq: Once | INTRAMUSCULAR | Status: DC | PRN

## 2020-04-27 MED ORDER — ALBUTEROL SULFATE HFA 108 (90 BASE) MCG/ACT IN AERS: 2.0000 | INHALATION_SPRAY | Freq: Once | RESPIRATORY_TRACT | Status: DC | PRN

## 2020-04-27 MED ORDER — SOTROVIMAB 500 MG/8ML IV SOLN
500.0000 mg | Freq: Once | INTRAVENOUS | Status: AC
  Administered 2020-04-27: 500 mg via INTRAVENOUS

## 2020-04-27 MED ORDER — FAMOTIDINE IN NACL 20-0.9 MG/50ML-% IV SOLN: 20.0000 mg | Freq: Once | INTRAVENOUS | Status: DC | PRN

## 2020-04-27 MED ORDER — DIPHENHYDRAMINE HCL 50 MG/ML IJ SOLN: 50.0000 mg | Freq: Once | INTRAMUSCULAR | Status: DC | PRN

## 2020-04-27 MED ORDER — METHYLPREDNISOLONE SODIUM SUCC 125 MG IJ SOLR: 125.0000 mg | Freq: Once | INTRAMUSCULAR | Status: DC | PRN

## 2020-04-27 MED ORDER — SODIUM CHLORIDE 0.9 % IV SOLN: INTRAVENOUS | Status: DC | PRN

## 2020-04-27 NOTE — Telephone Encounter (Signed)
Thanks for the heads up - I was aware of infusion plan  Will watch for correspondence  Please check in with her late today or tomorrow am as well  Thanks

## 2020-04-27 NOTE — Discharge Instructions (Signed)
Continue salt water gargles, lozenges and chloraseptic spray as needed for comfort   Can use hot ( teas, broth) or cold liquids/foods ( ice cream, popsicles) to provide relief and to stay hydrated

## 2020-04-27 NOTE — Telephone Encounter (Signed)
Per pt appt notes pt is scheduled for infusion 04/27/20 at 11;30. I spoke with pt and she did not go to UC on 04/26/20 but pt is going to [facility] now due to fever of 102, cough and congestion. Sending note to Dr Milinda Antis.

## 2020-04-27 NOTE — Progress Notes (Signed)
Patient reviewed Fact Sheet for Patients, Parents, and Caregivers for Emergency Use Authorization (EUA) of sotrovimab for the Treatment of Coronavirus. Patient also reviewed and is agreeable to the estimated cost of treatment. Patient is agreeable to proceed.   

## 2020-04-27 NOTE — Telephone Encounter (Signed)
[facility] Night - Client TELEPHONE ADVICE RECORD [facility] Patient Name: Jillian Fleming Gender: Female DOB: 16-Mar-1980 Age: 40 Y 8 M 21 D Return Phone Number: [phone] (Primary) Address[place] [address] Client [facility] Night - Client Client Site [facility] [place] - Night Physician [place], Idamae Schuller - MD Contact Type Call Who Is Calling Patient / Member / Family / Caregiver Call Type Triage / Clinical Relationship To Patient Self Return Phone Number [phone] (Primary) Chief Complaint Cough Reason for Call Medication Question Initial Comment Caller states she needs to know what cold medicine to take while six months pregnant? She was positive for COVID 3 days a go. She has a bad cough and sore throat. Translation No Nurse Assessment Nurse: Terrence Dupont, RN, [place] Date/Time (Eastern Time): 04/26/2020 10:21:54 AM Confirm and document reason for call. If symptomatic, describe symptoms. ---Caller states she needs to know what cold medicine to take while six months pregnant? She was positive for COVID 3 days a go. She has a bad cough and sore throat. caller states her throat did not start hurting until she started coughing last night. can swallow saliva Does the patient have any new or worsening symptoms? ---Yes Will a triage be completed? ---Yes Related visit to physician within the last 2 weeks? ---No Does the PT have any chronic conditions? (i.e. diabetes, asthma, this includes High risk factors for pregnancy, etc.) ---No Is the patient pregnant or possibly pregnant? (Ask all females between the ages of 77-55) ---Yes How many weeks gestation? ---25 weeks 4 days What is the estimated delivery date? ---2020-08-06 Total number of pregnancies including current? ---4 Number of live births? ---3 Have you felt decreased fetal movement? ---No Is this a behavioral health or substance abuse call?  ---No PLEASE NOTE: All timestamps contained within this report are represented as [place] Standard Time. CONFIDENTIALTY NOTICE: This fax transmission is intended only for the addressee. It contains information that is legally privileged, confidential or otherwise protected from use or disclosure. If you are not the intended recipient, you are strictly prohibited from reviewing, disclosing, copying using or disseminating any of this information or taking any action in reliance on or regarding this information. If you have received this fax in error, please notify us immediately by telephone so that we can arrange for its return to [place]. Phone: [phone], Toll-Free: [phone], Fax: [phone] Page: 2 of 2 Call Id: [id number] Guidelines Guideline Title Affirmed Question Affirmed Notes Nurse Date/Time Lamount Cohen Time) COVID-19 - Diagnosed or Suspected Fever > 103 F (39.4 C) McClarnon, RN, [facility] 04/26/2020 10:24:30 AM Disp. Time Lamount Cohen Time) Disposition Final User 04/26/2020 10:27:06 AM See HCP within 4 Hours (or PCP triage) Yes McClarnon, RN, Sharene Butters Disagree/Comply Comply Caller Understands Yes PreDisposition Did not know what to do Care Advice Given Per Guideline SEE HCP (OR PCP TRIAGE) WITHIN 4 HOURS: CARE ADVICE given per COVID-19 - DIAGNOSED OR SUSPECTED (Adult) guideline. * You become worse CALL BACK IF: Comments User: Donzetta Matters, RN Date/Time Lamount Cohen Time): 04/26/2020 10:26:16 AM caller is scheduled for antibody treatment on Monday User: Donzetta Matters, RN Date/Time Lamount Cohen Time): 04/26/2020 10:31:17 AM caller checked her temperature and it was 103 referred to the UC. states she will be there right when it opens. Instructed with pregnancy she could use Saline nasal spray, Tylenol for pain and plain robitussin for cough. caller states understanding Referrals [facility] - UC

## 2020-04-27 NOTE — Progress Notes (Signed)
Diagnosis: COVID-19  Physician: Dr. Patrick Wright  Procedure: Covid Infusion Clinic Med: Sotrovimab infusion - Provided patient with sotrovimab fact sheet for patients, parents, and caregivers prior to infusion.   Complications: No immediate complications noted  Discharge: Discharged home    

## 2020-04-27 NOTE — ED Provider Notes (Addendum)
[facility]    CSN: [id number] Arrival date & time: 04/27/20  1660      History   Chief Complaint Chief Complaint  Patient presents with  . Covid + : Sore Throat     HPI Jillian Fleming is a 40 y.o. female.   Patient presents with worsening sore throat and congestion. Describes as dry and scratchy.Had fever Friday, has resolved. Tested positive for COVID 2/11. Attempted salt water gargles, lozenges and chloraseptic spray with no relief. Currently pregnant, 25 weeks 5 days.   Past Medical History:  Diagnosis Date  . History of antiphospholipid syndrome    is on asa and has to have lovenox for pregnancy  . History of blood transfusion 2005   after miscarriage  . History of miscarriage     Patient Active Problem List   Diagnosis Date Noted  . COVID-19 04/24/2020  . Obesity (BMI 30-39.9) 08/09/2019  . Rhomboid muscle strain 10/23/2017  . Tinea versicolor 10/14/2016  . Allergic rhinitis 04/26/2016  . Breast lump in female 12/01/2015  . Elevated blood pressure reading 04/10/2015  . 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 Food allergy 02/27/2013  . Routine general medical examination at a health care facility 02/19/2013  . Overweight 04/07/2009  . Anti-phospholipid antibody syndrome (HCC) 04/07/2009    Past Surgical History:  Procedure Laterality Date  . BIOPSY BREAST  2001  . DILATION AND CURETTAGE OF UTERUS    . HYSTEROSCOPY     surgery to remove scar tissue    OB History    Gravida  4   Para  2   Term  2   Preterm      AB      Living  1     SAB      IAB      Ectopic      Multiple      Live Births  1            Home Medications    Prior to Admission medications   Medication Sig Start Date End Date Taking? Authorizing Provider  aspirin 81 MG tablet Take 81 mg by mouth daily.    [provider]  enoxaparin (LOVENOX) 40 MG/0.4ML injection Inject into the skin daily. 12/25/19   [provider]  fluticasone (FLONASE) 50 MCG/ACT nasal spray Place 2 sprays into both nostrils daily as needed for allergies or rhinitis. 04/26/16   Tower, Audrie Gallus, MD    Family History Family History  Problem Relation Age of Onset  . Hypertension Mother   . Prostate cancer Father     Social History Social History   Tobacco Use  . Smoking status: Never Smoker  . Smokeless tobacco: Never Used  Substance Use Topics  . Alcohol use: No    Alcohol/week: 0.0 standard drinks  . Drug use: No     Allergies   Patient has no known allergies.   Review of Systems Review of Systems  Constitutional: Negative.   HENT: Positive for congestion, rhinorrhea and sore throat. Negative for dental problem, drooling, ear discharge, ear pain, facial swelling, hearing loss, mouth sores, nosebleeds, postnasal drip, sinus pressure, sinus pain, sneezing, tinnitus, trouble swallowing and voice change.   Eyes: Negative.   Respiratory: Negative.   Cardiovascular: Negative.   Gastrointestinal: Negative.   Neurological: Negative.      Physical Exam Triage Vital Signs ED Triage Vitals  Enc Vitals Group     BP 04/27/20 1048 120/74  Pulse Rate 04/27/20 1048 100     Resp 04/27/20 1048 20     Temp 04/27/20 1048 98.8 F (37.1 C)     Temp Source 04/27/20 1048 Oral     SpO2 04/27/20 1048 97 %     Weight --      Height --      Head Circumference --      Peak Flow --      Pain Score 04/27/20 1046 7     Pain Loc --      Pain Edu? --      Excl. in GC? --    No data found.  Updated Vital Signs BP 120/74 (BP Location: Left Arm)   Pulse 100   Temp 98.8 F (37.1 C) (Oral)   Resp 20   LMP 08/03/2019   SpO2 97%   Visual Acuity Right Eye Distance:   Left Eye Distance:   Bilateral Distance:    Right Eye Near:   Left Eye Near:    Bilateral Near:     Physical Exam Constitutional:      Appearance: Normal appearance. She is normal weight.  HENT:     Head: Normocephalic.     Right Ear:  Tympanic membrane, ear canal and external ear normal.     Left Ear: Tympanic membrane, ear canal and external ear normal.     Nose: Congestion and rhinorrhea present.     Right Turbinates: Swollen and pale.     Left Turbinates: Swollen and pale.     Mouth/Throat:     Mouth: Mucous membranes are moist.     Pharynx: Uvula midline. Posterior oropharyngeal erythema present. No pharyngeal swelling or oropharyngeal exudate.  Eyes:     Extraocular Movements: Extraocular movements intact.     Conjunctiva/sclera: Conjunctivae normal.     Pupils: Pupils are equal, round, and reactive to light.  Cardiovascular:     Rate and Rhythm: Normal rate and regular rhythm.     Pulses: Normal pulses.     Heart sounds: Normal heart sounds.  Pulmonary:     Effort: Pulmonary effort is normal.     Breath sounds: Normal breath sounds.  Musculoskeletal:        General: Normal range of motion.     Cervical back: Normal range of motion.  Skin:    General: Skin is warm and dry.  Neurological:     General: No focal deficit present.     Mental Status: She is alert and oriented to person, place, and time.  Psychiatric:        Mood and Affect: Mood normal.        Behavior: Behavior normal.        Thought Content: Thought content normal.        Judgment: Judgment normal.      UC Treatments / Results  Labs (all labs ordered are listed, but only abnormal results are displayed) Labs Reviewed  POCT RAPID STREP A, ED / UC    EKG   Radiology No results found.  Procedures Procedures (including critical care time)  Medications Ordered in UC Medications - No data to display  Initial Impression / Assessment and Plan / UC Course  I have reviewed the triage vital signs and the nursing notes.  Pertinent labs & imaging results that were available during my care of the patient were reviewed by me and considered in my medical decision making (see chart for details).  Sore throat r/t to COVID  1.rapid  strep- negative 2. COVID positive 2/11 3. Warm slat gargles 4. OTC lozenges 5. Continue use of chloraseptic spray  Final Clinical Impressions(s) / UC Diagnoses   Final diagnoses:  None   Discharge Instructions   None    ED Prescriptions    None     PDMP not reviewed this encounter.   Valinda Hoar, NP 04/27/20 [address], [place] 05/06/20 [phone]

## 2020-04-27 NOTE — ED Triage Notes (Signed)
Pt covid + as of Friday 04/24/20 and is having a very sore throat.

## 2020-04-27 NOTE — Discharge Instructions (Signed)

## 2020-04-28 NOTE — Telephone Encounter (Signed)
Called to check on pt. Pt said she feels "much better" since the infusion. She has no fever, her voice is coming back, she isn't as congested, and her throat is starting to feel better, it's still sore but not as sore as it has been. Pt advise if sxs don't continue to improve or if she develops any new sxs to let us know. Pt verbalized understanding and thanked [place] for checking on her

## 2020-04-29 LAB — CULTURE, GROUP A STREP (THRC)

## 2020-06-29 ENCOUNTER — Ambulatory Visit: Payer: BC Managed Care – PPO | Attending: Nurse Practitioner

## 2020-06-29 DIAGNOSIS — G4733 Obstructive sleep apnea (adult) (pediatric): Secondary | ICD-10-CM | POA: Diagnosis present

## 2020-06-29 DIAGNOSIS — R0683 Snoring: Secondary | ICD-10-CM | POA: Insufficient documentation

## 2020-06-30 ENCOUNTER — Other Ambulatory Visit: Payer: Self-pay

## 2020-10-13 ENCOUNTER — Telehealth: Payer: Self-pay | Admitting: Family Medicine

## 2020-10-13 DIAGNOSIS — Z0000 Encounter for general adult medical examination without abnormal findings: Secondary | ICD-10-CM

## 2020-10-13 NOTE — Telephone Encounter (Signed)
-----   Message from Aquilla Solian, RT sent at 09/28/2020  9:56 AM EDT ----- Regarding: Lab Orders for Wednesday 8.3.2022 Please place lab orders for Wednesday 8.3.2022, office visit for physical on Wednesday 8.17.2022 Thank you, Jones Bales RT(R)

## 2020-10-14 ENCOUNTER — Other Ambulatory Visit: Payer: Self-pay

## 2020-10-14 ENCOUNTER — Other Ambulatory Visit (INDEPENDENT_AMBULATORY_CARE_PROVIDER_SITE_OTHER): Payer: BC Managed Care – PPO

## 2020-10-14 DIAGNOSIS — Z0000 Encounter for general adult medical examination without abnormal findings: Secondary | ICD-10-CM | POA: Diagnosis not present

## 2020-10-14 LAB — CBC WITH DIFFERENTIAL/PLATELET
Basophils Absolute: 0 10*3/uL (ref 0.0–0.1)
Basophils Relative: 0.9 % (ref 0.0–3.0)
Eosinophils Absolute: 0.8 10*3/uL — ABNORMAL HIGH (ref 0.0–0.7)
Eosinophils Relative: 15 % — ABNORMAL HIGH (ref 0.0–5.0)
HCT: 40.9 % (ref 36.0–46.0)
Hemoglobin: 13.2 g/dL (ref 12.0–15.0)
Lymphocytes Relative: 22.2 % (ref 12.0–46.0)
Lymphs Abs: 1.2 10*3/uL (ref 0.7–4.0)
MCHC: 32.3 g/dL (ref 30.0–36.0)
MCV: 87.8 fl (ref 78.0–100.0)
Monocytes Absolute: 0.3 10*3/uL (ref 0.1–1.0)
Monocytes Relative: 5.4 % (ref 3.0–12.0)
Neutro Abs: 3.2 10*3/uL (ref 1.4–7.7)
Neutrophils Relative %: 56.5 % (ref 43.0–77.0)
Platelets: 251 10*3/uL (ref 150.0–400.0)
RBC: 4.65 Mil/uL (ref 3.87–5.11)
RDW: 13.5 % (ref 11.5–15.5)
WBC: 5.6 10*3/uL (ref 4.0–10.5)

## 2020-10-14 LAB — COMPREHENSIVE METABOLIC PANEL
ALT: 13 U/L (ref 0–35)
AST: 15 U/L (ref 0–37)
Albumin: 4.5 g/dL (ref 3.5–5.2)
Alkaline Phosphatase: 71 U/L (ref 39–117)
BUN: 13 mg/dL (ref 6–23)
CO2: 23 mEq/L (ref 19–32)
Calcium: 9.2 mg/dL (ref 8.4–10.5)
Chloride: 105 mEq/L (ref 96–112)
Creatinine, Ser: 1.11 mg/dL (ref 0.40–1.20)
GFR: 61.88 mL/min (ref >60.00)
Glucose, Bld: 82 mg/dL (ref 70–99)
Potassium: 3.9 mEq/L (ref 3.5–5.1)
Sodium: 138 mEq/L (ref 135–145)
Total Bilirubin: 0.6 mg/dL (ref 0.2–1.2)
Total Protein: 7.4 g/dL (ref 6.0–8.3)

## 2020-10-14 LAB — LIPID PANEL
Cholesterol: 141 mg/dL (ref 0–200)
HDL: 42.4 mg/dL (ref >39.00)
LDL Cholesterol: 90 mg/dL (ref 0–99)
NonHDL: 99.06
Total CHOL/HDL Ratio: 3
Triglycerides: 45 mg/dL (ref 0.0–149.0)
VLDL: 9 mg/dL (ref 0.0–40.0)

## 2020-10-14 LAB — TSH: TSH: 2.24 u[IU]/mL (ref 0.35–5.50)

## 2020-10-28 ENCOUNTER — Ambulatory Visit (INDEPENDENT_AMBULATORY_CARE_PROVIDER_SITE_OTHER): Payer: BC Managed Care – PPO | Admitting: Family Medicine

## 2020-10-28 ENCOUNTER — Encounter: Payer: Self-pay | Admitting: Family Medicine

## 2020-10-28 ENCOUNTER — Other Ambulatory Visit: Payer: Self-pay

## 2020-10-28 VITALS — BP 138/90 | HR 77 | Temp 97.6°F | Ht 63.5 in | Wt 185.1 lb

## 2020-10-28 DIAGNOSIS — D6861 Antiphospholipid syndrome: Secondary | ICD-10-CM | POA: Diagnosis not present

## 2020-10-28 DIAGNOSIS — R030 Elevated blood-pressure reading, without diagnosis of hypertension: Secondary | ICD-10-CM

## 2020-10-28 DIAGNOSIS — Z0000 Encounter for general adult medical examination without abnormal findings: Secondary | ICD-10-CM

## 2020-10-28 DIAGNOSIS — E669 Obesity, unspecified: Secondary | ICD-10-CM

## 2020-10-28 MED ORDER — ASPIRIN 81 MG PO TBEC: 81.0000 mg | DELAYED_RELEASE_TABLET | Freq: Every day | ORAL | 3 refills | Status: DC

## 2020-10-28 NOTE — Progress Notes (Signed)
Subjective:    Patient ID: Jillian Fleming, female    DOB: March 09, 1981, 40 y.o.   MRN: [id number]  This visit occurred during the SARS-CoV-2 public health emergency.  Safety protocols were in place, including screening questions prior to the visit, additional usage of staff PPE, and extensive cleaning of exam room while observing appropriate contact time as indicated for disinfecting solutions.   HPI Here for health maintenance exam and to review chronic medical problems    Wt Readings from Last 3 Encounters:  10/28/20 185 lb 2 oz (84 kg)  08/09/19 179 lb 6 oz (81.4 kg)  03/08/18 180 lb 8 oz (81.9 kg)   32.28 kg/m   Baby is starting to sleep through the night  Started work back this week (still remote much of the time)  Other kids started school   Not much time for healthy diet and exercise     Pap 10/21 nl with neg HPV  Recently had a baby  Menses- back to normal  Had BTL for contraception   Pap report [facility]: PAP Smear Specimen:  AP Specimen - Cervix uteri structure (body structure) Component 9 mo ago  Case Report  Gynecologic Cytology Report                       Case: [id number]                                Authorizing Provider:  Celene Skeen, MD       Collected:           01/10/2020 1428              Ordering Location:     [facility]         Received:            01/13/2020 1055                                     Medicine at [facility]                                                    First Screen:          Belia Heman [place]                                                            Specimen:    Liquid-Based Pap Smear, Cervix                                                         Interpretation  Negative for intraepithelial lesion or malignancy   Electronically signed by Raelyn Mora on 01/17/2020 at  3:50 PM  Specimen Adequacy  Satisfactory for evaluation, endocervical/transformation zone component absent   LMP  10/27/19  PAP EDUCATIONAL NOTE       Covid immunized and had covid when pregnant in feb (got IV ab) Flu shot-fall Tdap 3/22  Mammogram 8/21-planned today Self breast exam - no new lumps (did recently breast feed)  BP is elevated today and at recent gyn appt  BP Readings from Last 3 Encounters:  10/28/20 138/90  04/27/20 119/68  04/27/20 120/74    Pulse Readings from Last 3 Encounters:  10/28/20 77  04/27/20 98  04/27/20 100   Bp was not elevated in pregnancy  No headaches or other symptoms for the most part   Bp at home is normal   usually aroung 130/80    Labs   Lab Results  Component Value Date   CHOL 141 10/14/2020   CHOL 113 08/06/2019   CHOL 106 04/26/2016   Lab Results  Component Value Date   HDL 42.40 10/14/2020   HDL 35.50 (L) 08/06/2019   HDL 37.00 (L) 04/26/2016   Lab Results  Component Value Date   LDLCALC 90 10/14/2020   LDLCALC 66 08/06/2019   LDLCALC 59 04/26/2016   Lab Results  Component Value Date   TRIG 45.0 10/14/2020   TRIG 60.0 08/06/2019   TRIG 49.0 04/26/2016   Lab Results  Component Value Date   CHOLHDL 3 10/14/2020   CHOLHDL 3 08/06/2019   CHOLHDL 3 04/26/2016   No results found for: LDLDIRECT  Diet lately - about the same She eats when /what she can [employer] [place] No fast food  Some red meat  No fatty dairy  Some fried snacks /potato chips   Patient Active Problem List   Diagnosis Date Noted   Obesity (BMI 30-39.9) 08/09/2019   Allergic rhinitis 04/26/2016   Breast lump in female 12/01/2015   Elevated blood pressure reading 04/10/2015   Adjustment reaction with anxiety and depression 01/28/2014   Food allergy 02/27/2013   Routine general medical examination at a health care facility 02/19/2013   Anti-phospholipid antibody syndrome (HCC) 04/07/2009   Past Medical History:  Diagnosis Date   History of antiphospholipid syndrome    is on asa and has to have lovenox for pregnancy   History of blood transfusion 2005   after miscarriage   History of  miscarriage    Past Surgical History:  Procedure Laterality Date   BIOPSY BREAST  2001   DILATION AND CURETTAGE OF UTERUS     HYSTEROSCOPY     surgery to remove scar tissue   Social History   Tobacco Use   Smoking status: Never   Smokeless tobacco: Never  Substance Use Topics   Alcohol use: No    Alcohol/week: 0.0 standard drinks   Drug use: No   Family History  Problem Relation Age of Onset   Hypertension Mother    Prostate cancer Father    No Known Allergies Current Outpatient Medications on File Prior to Visit  Medication Sig Dispense Refill   fluticasone (FLONASE) 50 MCG/ACT nasal spray Place 2 sprays into both nostrils daily as needed for allergies or rhinitis. 16 g 11   No current facility-administered medications on file prior to visit.    Review of Systems  Constitutional:  Positive for fatigue. Negative for activity change, appetite change, fever and unexpected weight change.  HENT:  Negative for congestion, ear pain, rhinorrhea, sinus pressure and sore throat.   Eyes:  Negative for pain, redness and visual disturbance.  Respiratory:  Negative for cough, shortness of breath and wheezing.   Cardiovascular:  Negative  for chest pain and palpitations.  Gastrointestinal:  Negative for abdominal pain, blood in stool, constipation and diarrhea.  Endocrine: Negative for polydipsia and polyuria.  Genitourinary:  Negative for dysuria, frequency and urgency.  Musculoskeletal:  Negative for arthralgias, back pain and myalgias.  Skin:  Negative for pallor and rash.  Allergic/Immunologic: Negative for environmental allergies.  Neurological:  Negative for dizziness, syncope and headaches.  Hematological:  Negative for adenopathy. Does not bruise/bleed easily.  Psychiatric/Behavioral:  Negative for decreased concentration and dysphoric mood. The patient is not nervous/anxious.       Objective:   Physical Exam Constitutional:      General: She is not in acute distress.     Appearance: Normal appearance. She is well-developed. She is obese. She is not ill-appearing or diaphoretic.  HENT:     Head: Normocephalic and atraumatic.     Right Ear: Tympanic membrane, ear canal and external ear normal.     Left Ear: Tympanic membrane, ear canal and external ear normal.     Nose: Nose normal. No congestion.     Mouth/Throat:     Mouth: Mucous membranes are moist.     Pharynx: Oropharynx is clear. No posterior oropharyngeal erythema.  Eyes:     General: No scleral icterus.    Extraocular Movements: Extraocular movements intact.     Conjunctiva/sclera: Conjunctivae normal.     Pupils: Pupils are equal, round, and reactive to light.  Neck:     Thyroid: No thyromegaly.     Vascular: No carotid bruit or JVD.  Cardiovascular:     Rate and Rhythm: Normal rate and regular rhythm.     Pulses: Normal pulses.     Heart sounds: Normal heart sounds.    No gallop.  Pulmonary:     Effort: Pulmonary effort is normal. No respiratory distress.     Breath sounds: Normal breath sounds. No wheezing.     Comments: Good air exch Chest:     Chest wall: No tenderness.  Abdominal:     General: Bowel sounds are normal. There is no distension or abdominal bruit.     Palpations: Abdomen is soft. There is no mass.     Tenderness: There is no abdominal tenderness.     Hernia: No hernia is present.  Genitourinary:    Comments: Breast exam: No mass, nodules, thickening, tenderness, bulging, retraction, inflamation, nipple discharge or skin changes noted.  No axillary or clavicular LA.  Dense breast tissue but no change (of note finished breast feeding recently)    Musculoskeletal:        General: No tenderness. Normal range of motion.     Cervical back: Normal range of motion and neck supple. No rigidity. No muscular tenderness.     Right lower leg: No edema.     Left lower leg: No edema.  Lymphadenopathy:     Cervical: No cervical adenopathy.  Skin:    General: Skin is warm and dry.      Coloration: Skin is not pale.     Findings: No erythema or rash.  Neurological:     Mental Status: She is alert. Mental status is at baseline.     Cranial Nerves: No cranial nerve deficit.     Motor: No abnormal muscle tone.     Coordination: Coordination normal.     Gait: Gait normal.     Deep Tendon Reflexes: Reflexes are normal and symmetric. Reflexes normal.  Psychiatric:        Mood and  Affect: Mood normal.        Cognition and Memory: Cognition and memory normal.     Comments: Pleasant           Assessment & Plan:   Problem List Items Addressed This Visit       Hematopoietic and Hemostatic   Anti-phospholipid antibody syndrome (HCC)    Continues daily asa 81 mg         Other   Routine general medical examination at a health care facility - Primary    Reviewed health habits including diet and exercise and skin cancer prevention Reviewed appropriate screening tests for age  Also reviewed health mt list, fam hx and immunization status , as well as social and family history   See HPI Labs reviewed  utd pap  Mammogram planned today  covid immunized and plans flu shot and booster in fall if applicable  Cholesterol is up a bit-discussed diet and handout given Plan to watch bp      Elevated blood pressure reading    BP: 138/90  Per pt better at home Was up at gyn office also   fam h/o HTN in mother Enc good diet and exercise -handout given for DASH eating  Plan to take bp at home and f/u with cuff to calibrate Handout re: HTN and taking bp given      Obesity (BMI 30-39.9)    Discussed how this problem influences overall health and the risks it imposes  Reviewed plan for weight loss with lower calorie diet (via better food choices and also portion control or program like weight watchers) and exercise building up to or more than 30 minutes 5 days per week including some aerobic activity   Recent pregnancy-working to loose baby weight

## 2020-10-28 NOTE — Assessment & Plan Note (Signed)
Discussed how this problem influences overall health and the risks it imposes  Reviewed plan for weight loss with lower calorie diet (via better food choices and also portion control or program like weight watchers) and exercise building up to or more than 30 minutes 5 days per week including some aerobic activity   Recent pregnancy-working to loose baby weight

## 2020-10-28 NOTE — Assessment & Plan Note (Signed)
Reviewed health habits including diet and exercise and skin cancer prevention Reviewed appropriate screening tests for age  Also reviewed health mt list, fam hx and immunization status , as well as social and family history   See HPI Labs reviewed  utd pap  Mammogram planned today  covid immunized and plans flu shot and booster in fall if applicable  Cholesterol is up a bit-discussed diet and handout given Plan to watch bp

## 2020-10-28 NOTE — Patient Instructions (Addendum)
Get your flu shot in the fall   Cholesterol went up (still ok though) Avoid red meat/ fried foods/ egg yolks/ fatty breakfast meats/ butter, cheese and high fat dairy/ and shellfish    Check BP at home when you are relaxed and keep a log   Follow up in about 1-2 months with your cuff

## 2020-10-28 NOTE — Assessment & Plan Note (Signed)
Continues daily asa 81 mg

## 2020-10-28 NOTE — Assessment & Plan Note (Signed)
BP: 138/90  Per pt better at home Was up at gyn office also   fam h/o HTN in mother Enc good diet and exercise -handout given for DASH eating  Plan to take bp at home and f/u with cuff to calibrate Handout re: HTN and taking bp given

## 2020-12-28 ENCOUNTER — Ambulatory Visit: Payer: BC Managed Care – PPO | Admitting: Family Medicine

## 2021-02-15 LAB — HM MAMMOGRAPHY

## 2021-02-18 ENCOUNTER — Other Ambulatory Visit: Payer: Self-pay

## 2021-02-18 ENCOUNTER — Ambulatory Visit: Payer: BC Managed Care – PPO | Admitting: Family Medicine

## 2021-02-18 ENCOUNTER — Encounter: Payer: Self-pay | Admitting: Family Medicine

## 2021-02-18 DIAGNOSIS — K429 Umbilical hernia without obstruction or gangrene: Secondary | ICD-10-CM | POA: Diagnosis not present

## 2021-02-18 NOTE — Assessment & Plan Note (Signed)
This has occurred since last childbirth  More noticeable when standing, reducible and not painful  Ref done to general surgeon for eval at pt's request  Handout given /antic guidance  Discussed need to use caution with straining/lifting  Discussed ER precautions (signs of incarceration discussed)

## 2021-02-18 NOTE — Patient Instructions (Signed)
I think this is an umbilical hernia   Watch for pain or inability to reduce (push it in)  If severe-got to the ER  Keep [place] posted   I placed a general surgery referral for [facility]   You will get a call to set this up   Avoid heavy lifting and straining as much as you can   If worse or other concerns, let us know in the meantime

## 2021-02-18 NOTE — Progress Notes (Signed)
Subjective:    Patient ID: Jillian Fleming, female    DOB: 1980/11/06, 40 y.o.   MRN: [id number]  This visit occurred during the SARS-CoV-2 public health emergency.  Safety protocols were in place, including screening questions prior to the visit, additional usage of staff PPE, and extensive cleaning of exam room while observing appropriate contact time as indicated for disinfecting solutions.   HPI Pt presents to check a spot in belly button   Wt Readings from Last 3 Encounters:  02/18/21 185 lb 6 oz (84.1 kg)  10/28/20 185 lb 2 oz (84 kg)  08/09/19 179 lb 6 oz (81.4 kg)   32.32 kg/m  When she was pregnant she had discomfort in the belly button area  ? If early hernia  Was sporatic after that  Can now feel it/aware of it but not painful  (2 weeks)  Feels like she can push it in  [employer] and swishy   She feels like it had grown, is getting bigger   Patient Active Problem List   Diagnosis Date Noted   Umbilical hernia 02/18/2021   Obesity (BMI 30-39.9) 08/09/2019   Allergic rhinitis 04/26/2016   Breast lump in female 12/01/2015   Elevated blood pressure reading 04/10/2015   Adjustment reaction with anxiety and depression 01/28/2014   Food allergy 02/27/2013   Routine general medical examination at a health care facility 02/19/2013   Anti-phospholipid antibody syndrome (HCC) 04/07/2009   Past Medical History:  Diagnosis Date   History of antiphospholipid syndrome    is on asa and has to have lovenox for pregnancy   History of blood transfusion 2005   after miscarriage   History of miscarriage    Past Surgical History:  Procedure Laterality Date   BIOPSY BREAST  2001   DILATION AND CURETTAGE OF UTERUS     HYSTEROSCOPY     surgery to remove scar tissue   Social History   Tobacco Use   Smoking status: Never   Smokeless tobacco: Never  Substance Use Topics   Alcohol use: No    Alcohol/week: 0.0 standard drinks   Drug use: No   Family History  Problem  Relation Age of Onset   Hypertension Mother    Prostate cancer Father    No Known Allergies Current Outpatient Medications on File Prior to Visit  Medication Sig Dispense Refill   fluticasone (FLONASE) 50 MCG/ACT nasal spray Place 2 sprays into both nostrils daily as needed for allergies or rhinitis. 16 g 11   No current facility-administered medications on file prior to visit.    Review of Systems  Constitutional:  Negative for activity change, appetite change, fatigue, fever and unexpected weight change.  HENT:  Negative for congestion, ear pain, rhinorrhea, sinus pressure and sore throat.   Eyes:  Negative for pain, redness and visual disturbance.  Respiratory:  Negative for cough, shortness of breath and wheezing.   Cardiovascular:  Negative for chest pain and palpitations.  Gastrointestinal:  Negative for abdominal pain, blood in stool, constipation and diarrhea.  Endocrine: Negative for polydipsia and polyuria.  Genitourinary:  Negative for dysuria, frequency and urgency.  Musculoskeletal:  Negative for arthralgias, back pain and myalgias.  Skin:  Negative for pallor and rash.  Allergic/Immunologic: Negative for environmental allergies.  Neurological:  Negative for dizziness, syncope and headaches.  Hematological:  Negative for adenopathy. Does not bruise/bleed easily.  Psychiatric/Behavioral:  Negative for decreased concentration and dysphoric mood. The patient is not nervous/anxious.  Objective:   Physical Exam Constitutional:      General: She is not in acute distress.    Appearance: Normal appearance. She is obese. She is not ill-appearing.  Eyes:     Conjunctiva/sclera: Conjunctivae normal.     Pupils: Pupils are equal, round, and reactive to light.  Cardiovascular:     Rate and Rhythm: Normal rate and regular rhythm.     Heart sounds: Normal heart sounds.  Pulmonary:     Effort: Pulmonary effort is normal. No respiratory distress.     Breath sounds: Normal  breath sounds. No wheezing or rales.  Abdominal:     General: Abdomen is flat. Bowel sounds are normal. There is no distension.     Palpations: Abdomen is soft.     Tenderness: There is no abdominal tenderness. There is no guarding or rebound.     Hernia: A hernia is present. Hernia is present in the umbilical area.     Comments: Small umbilical hernia at 5:00 position, palpable with standing and easily reducible Non tender   Musculoskeletal:     Cervical back: Neck supple. No tenderness.  Lymphadenopathy:     Cervical: No cervical adenopathy.  Skin:    Coloration: Skin is not pale.     Findings: No erythema or rash.  Neurological:     Sensory: No sensory deficit.  Psychiatric:        Mood and Affect: Mood normal.          Assessment & Plan:   Problem List Items Addressed This Visit       Other   Umbilical hernia    This has occurred since last childbirth  More noticeable when standing, reducible and not painful  Ref done to general surgeon for eval at pt's request  Handout given /antic guidance  Discussed need to use caution with straining/lifting  Discussed ER precautions (signs of incarceration discussed)      Relevant Orders   Ambulatory referral to General Surgery

## 2021-03-18 ENCOUNTER — Encounter: Payer: Self-pay | Admitting: Family Medicine

## 2021-04-14 ENCOUNTER — Encounter: Payer: Self-pay | Admitting: Family Medicine

## 2021-04-14 ENCOUNTER — Ambulatory Visit (INDEPENDENT_AMBULATORY_CARE_PROVIDER_SITE_OTHER): Payer: BC Managed Care – PPO | Admitting: Family Medicine

## 2021-04-14 ENCOUNTER — Other Ambulatory Visit: Payer: Self-pay

## 2021-04-14 DIAGNOSIS — J029 Acute pharyngitis, unspecified: Secondary | ICD-10-CM

## 2021-04-14 DIAGNOSIS — J020 Streptococcal pharyngitis: Secondary | ICD-10-CM

## 2021-04-14 LAB — POCT RAPID STREP A (OFFICE): Rapid Strep A Screen: POSITIVE — AB

## 2021-04-14 MED ORDER — AMOXICILLIN-POT CLAVULANATE 875-125 MG PO TABS: 1.0000 | ORAL_TABLET | Freq: Two times a day (BID) | ORAL | 0 refills | Status: DC

## 2021-04-14 NOTE — Patient Instructions (Signed)
Drink lots of fluids  Rest   Ibuprofen and tylenol are fine  Salt water gargle   Take augmentin as directed   If you are suddenly worse call  If unable to swallow go to the ER

## 2021-04-14 NOTE — Progress Notes (Signed)
Subjective:    Patient ID: Jillian Fleming, female    DOB: [birth year], 41 y.o.   MRN: [id number]  This visit occurred during the SARS-CoV-2 public health emergency.  Safety protocols were in place, including screening questions prior to the visit, additional usage of staff PPE, and extensive cleaning of exam room while observing appropriate contact time as indicated for disinfecting solutions.   HPI Pt presents with ST and ear pain   Wt Readings from Last 3 Encounters:  04/14/21 178 lb (80.7 kg)  02/18/21 185 lb 6 oz (84.1 kg)  10/28/20 185 lb 2 oz (84 kg)   31.04 kg/m  Symptoms started on Sunday  By Monday ear pain and ST  Tuesday noted that ST was worse on the L side  Actually going outdoors helped a bit ? Due to humidity  Then worse last night  Covid neg   Today took ibuprofen and it helped a lot  ST is milder/ ears ok  No headache now (had one this am )  Never ran a fever   Neck is a little tender in L sided LNs   No one is sick at home   No runny or stuffy nose  No pnd  No cough at all   No rash   No chills or aches    Took : Robitussin for ST Nyquil  Throat spray  Gargling   Drinking lots of fluids   Results for orders placed or performed in visit on 04/14/21  POCT rapid strep A  Result Value Ref Range   Rapid Strep A Screen Positive (A) Negative      Patient Active Problem List   Diagnosis Date Noted   Strep throat [id number]   Umbilical hernia 0000000   Obesity (BMI 30-39.9) 08/09/2019   Allergic rhinitis 04/26/2016   Breast lump in female 12/01/2015   Elevated blood pressure reading 04/10/2015   Adjustment reaction with anxiety and depression 01/28/2014   Food allergy 02/27/2013   Routine general medical examination at a health care facility 02/19/2013   Anti-phospholipid antibody syndrome ([place]) 04/07/2009   Past Medical History:  Diagnosis Date   History of antiphospholipid syndrome    is on asa and has to have lovenox for  pregnancy   History of blood transfusion 2005   after miscarriage   History of miscarriage    Past Surgical History:  Procedure Laterality Date   BIOPSY BREAST  2001   DILATION AND CURETTAGE OF UTERUS     HYSTEROSCOPY     surgery to remove scar tissue   Social History   Tobacco Use   Smoking status: Never   Smokeless tobacco: Never  Substance Use Topics   Alcohol use: No    Alcohol/week: 0.0 standard drinks   Drug use: No   Family History  Problem Relation Age of Onset   Hypertension Mother    Prostate cancer Father    No Known Allergies Current Outpatient Medications on File Prior to Visit  Medication Sig Dispense Refill   fluticasone (FLONASE) 50 MCG/ACT nasal spray Place 2 sprays into both nostrils daily as needed for allergies or rhinitis. 16 g 11   No current facility-administered medications on file prior to visit.      Review of Systems  Constitutional:  Negative for activity change, appetite change, fatigue, fever and unexpected weight change.  HENT:  Positive for ear pain and sore throat. Negative for congestion, ear discharge, facial swelling, rhinorrhea, sinus pressure, trouble swallowing and voice  change.   Eyes:  Negative for pain, redness and visual disturbance.  Respiratory:  Negative for cough, shortness of breath and wheezing.   Cardiovascular:  Negative for chest pain and palpitations.  Gastrointestinal:  Negative for abdominal pain, blood in stool, constipation and diarrhea.  Endocrine: Negative for polydipsia and polyuria.  Genitourinary:  Negative for dysuria, frequency and urgency.  Musculoskeletal:  Negative for arthralgias, back pain and myalgias.  Skin:  Negative for pallor and rash.  Allergic/Immunologic: Negative for environmental allergies.  Neurological:  Negative for dizziness, syncope and headaches.  Hematological:  Negative for adenopathy. Does not bruise/bleed easily.  Psychiatric/Behavioral:  Negative for decreased concentration and  dysphoric mood. The patient is not nervous/anxious.       Objective:   Physical Exam Constitutional:      General: She is not in acute distress.    Appearance: Normal appearance. She is obese. She is not ill-appearing or diaphoretic.  HENT:     Head: Normocephalic and atraumatic.     Right Ear: Tympanic membrane and ear canal normal.     Left Ear: Tympanic membrane, ear canal and external ear normal.     Nose:     Comments: Nares are boggy    Mouth/Throat:     Mouth: Mucous membranes are moist.     Pharynx: Oropharyngeal exudate and posterior oropharyngeal erythema present.     Comments: Erythema -slightly worse on the L  Some exudate on tonsils  Eyes:     General:        Right eye: No discharge.        Left eye: No discharge.     Conjunctiva/sclera: Conjunctivae normal.     Pupils: Pupils are equal, round, and reactive to light.  Cardiovascular:     Rate and Rhythm: Regular rhythm. Tachycardia present.     Heart sounds: Normal heart sounds.  Pulmonary:     Effort: No respiratory distress.     Breath sounds: Normal breath sounds. No wheezing or rales.  Musculoskeletal:     Cervical back: Normal range of motion. No rigidity.  Lymphadenopathy:     Cervical: No cervical adenopathy.  Skin:    Findings: No rash.  Neurological:     Mental Status: She is alert.     Cranial Nerves: No cranial nerve deficit.  Psychiatric:        Mood and Affect: Mood normal.          Assessment & Plan:   Problem List Items Addressed This Visit       Respiratory   Strep throat    Sore throat with headache helped by ibuprofen  Pos strep test tx with augmentin bid 7d  Fluids/rest Contagious for 24 h into medication/will wear mask Salt water gargle  Ibuprofen prn  Watch for trouble swallowing  Update if not starting to improve in a week or if worsening  ER precautions discussed  Meds ordered this encounter  Medications   amoxicillin-clavulanate (AUGMENTIN) 875-125 MG tablet     Sig: Take 1 tablet by mouth 2 (two) times daily.    Dispense:  14 tablet    Refill:  0

## 2021-04-14 NOTE — Assessment & Plan Note (Signed)
Sore throat with headache helped by ibuprofen  Pos strep test tx with augmentin bid 7d  Fluids/rest Contagious for 24 h into medication/will wear mask Salt water gargle  Ibuprofen prn  Watch for trouble swallowing  Update if not starting to improve in a week or if worsening  ER precautions discussed  Meds ordered this encounter  Medications   amoxicillin-clavulanate (AUGMENTIN) 875-125 MG tablet    Sig: Take 1 tablet by mouth 2 (two) times daily.    Dispense:  14 tablet    Refill:  0

## 2021-07-23 ENCOUNTER — Encounter: Payer: Self-pay | Admitting: Family

## 2021-07-23 ENCOUNTER — Ambulatory Visit: Payer: BC Managed Care – PPO | Admitting: Family

## 2021-07-23 VITALS — BP 134/80 | HR 78 | Temp 98.9°F | Resp 16 | Ht 63.5 in | Wt 171.5 lb

## 2021-07-23 DIAGNOSIS — T783XXA Angioneurotic edema, initial encounter: Secondary | ICD-10-CM | POA: Diagnosis not present

## 2021-07-23 DIAGNOSIS — T7840XA Allergy, unspecified, initial encounter: Secondary | ICD-10-CM | POA: Diagnosis not present

## 2021-07-23 MED ORDER — EPINEPHRINE 0.3 MG/0.3ML IJ SOAJ: 0.3000 mg | INTRAMUSCULAR | 0 refills | Status: AC | PRN

## 2021-07-23 MED ORDER — PREDNISONE 20 MG PO TABS: ORAL_TABLET | ORAL | 0 refills | Status: DC

## 2021-07-23 NOTE — Assessment & Plan Note (Signed)
Benadryl qhs prednisone taper ?

## 2021-07-23 NOTE — Patient Instructions (Signed)
Due to recent changes in healthcare laws, you may see results of your imaging and/or laboratory studies on MyChart before I have had a chance to review them.  I understand that in some cases there may be results that are confusing or concerning to you. Please understand that not all results are received at the same time and often I may need to interpret multiple results in order to provide you with the best plan of care or course of treatment. Therefore, I ask that you please give me 2 business days to thoroughly review all your results before contacting my office for clarification. Should we see a critical lab result, you will be contacted sooner.   It was a pleasure seeing you today! Please do not hesitate to reach out with any questions and or concerns.  Regards,   Carmyn Jasan Doughtie FNP-C  

## 2021-07-23 NOTE — Assessment & Plan Note (Signed)
rx prednisone taper ?Benadryl qhs  ?Epi pen rx sent to pharmacy ?Referral to allergist as over ten years  ?Unable to identify trigger, suspected food from restaurant ?

## 2021-07-23 NOTE — Progress Notes (Signed)
? ?Established Patient Office Visit ? ?Subjective:  ?Patient ID: Jillian Fleming, female    DOB: [birth year]  Age: 41 y.o. MRN: [id number] ? ?CC:  ?Chief Complaint  ?Patient presents with  ? Allergic Reaction  ?  Started last night. Lips swollen.  ? ? ?HPI ?Jillian Fleming is here today with concerns.  ? ?Last night went to a baseball game, had dinner/went out to eat ([employer], eats there all of the time, eat the same thing as always/, when she got out of the shower she noticed her knees were burning hot and itching really bad.  ?When getting into bed she noticed her lips were swelling.  ? ?She does have allergies she knows of, ginger ale causes hives, alcohol causes bil eyelid swelling ?Has seen allergy specialist twice, but has been about ten years ago. ? ?She is three weeks post op for umbilical hernia repair, saw her surgeon, everything is healing well.  ?Did take benadryl last night. ? ?Past Medical History:  ?Diagnosis Date  ? History of antiphospholipid syndrome   ? is on asa and has to have lovenox for pregnancy  ? History of blood transfusion 2005  ? after miscarriage  ? History of miscarriage   ? ? ?Past Surgical History:  ?Procedure Laterality Date  ? BIOPSY BREAST  2001  ? DILATION AND CURETTAGE OF UTERUS    ? HYSTEROSCOPY    ? surgery to remove scar tissue  ? ? ?Family History  ?Problem Relation Age of Onset  ? Hypertension Mother   ? Prostate cancer Father   ? ? ?Social History  ? ?Socioeconomic History  ? Marital status: Married  ?  Spouse name: Not on file  ? Number of children: Not on file  ? Years of education: Not on file  ? Highest education level: Not on file  ?Occupational History  ? Not on file  ?Tobacco Use  ? Smoking status: Never  ? Smokeless tobacco: Never  ?Substance and Sexual Activity  ? Alcohol use: No  ?  Alcohol/week: 0.0 standard drinks  ? Drug use: No  ? Sexual activity: Not on file  ?Other Topics Concern  ? Not on file  ?Social History Narrative  ? Not on file  ? ?Social  Determinants of Health  ? ?Financial Resource Strain: Not on file  ?Food Insecurity: Not on file  ?Transportation Needs: Not on file  ?Physical Activity: Not on file  ?Stress: Not on file  ?Social Connections: Not on file  ?Intimate Partner Violence: Not on file  ? ? ?Outpatient Medications Prior to Visit  ?Medication Sig Dispense Refill  ? fluticasone (FLONASE) 50 MCG/ACT nasal spray Place 2 sprays into both nostrils daily as needed for allergies or rhinitis. 16 g 11  ? amoxicillin-clavulanate (AUGMENTIN) 875-125 MG tablet Take 1 tablet by mouth 2 (two) times daily. (Patient not taking: Reported on 07/23/2021) 14 tablet 0  ? ?No facility-administered medications prior to visit.  ? ? ?Allergies  ?Allergen Reactions  ? Other Hives  ?  GINGER ALE  ? Pollen Extract Itching  ? Alcohol   ?  Eyelids swell   ? ? ?ROS ?Review of Systems  ?Constitutional:  Negative for chills, fatigue and fever.  ?HENT:  Negative for sore throat and voice change.   ?     Swelling of upper and lower lips   ?Respiratory:  Negative for cough, chest tightness and shortness of breath.   ?Musculoskeletal:  Positive for arthralgias (last night burning hot  knee pain now resolved).  ? ?  ?Objective:  ?  ?Physical Exam ?Constitutional:   ?   General: She is not in acute distress. ?   Appearance: Normal appearance. She is normal weight. She is not ill-appearing, toxic-appearing or diaphoretic.  ?HENT:  ?   Mouth/Throat:  ?   Mouth: No oral lesions.  ?   Tongue: No lesions.  ?   Pharynx: No pharyngeal swelling.  ?   Comments: Upper and lower lips with lip swelling, angioedema ?Cardiovascular:  ?   Rate and Rhythm: Normal rate and regular rhythm.  ?Pulmonary:  ?   Effort: Pulmonary effort is normal.  ?Musculoskeletal:  ?   Right knee: No swelling. Normal range of motion. No tenderness.  ?   Left knee: No swelling. Normal range of motion. No tenderness.  ?Skin: ?   General: Skin is warm.  ?   Findings: No rash.  ?Neurological:  ?   Mental Status: She is  alert.  ? ? ?BP 134/80   Pulse 78   Temp 98.9 ?F (37.2 ?C)   Resp 16   Ht 5' 3.5" (1.613 m)   Wt 171 lb 8 oz (77.8 kg)   LMP 07/20/2021   SpO2 98%   BMI 29.90 kg/m?  ?Wt Readings from Last 3 Encounters:  ?07/23/21 171 lb 8 oz (77.8 kg)  ?04/14/21 178 lb (80.7 kg)  ?02/18/21 185 lb 6 oz (84.1 kg)  ? ? ? ?Health Maintenance Due  ?Topic Date Due  ? Hepatitis C Screening  Never done  ? COVID-19 Vaccine (3 - Booster for Pfizer series) 08/07/2019  ? ? ?There are no preventive care reminders to display for this patient. ? ?Lab Results  ?Component Value Date  ? TSH 2.24 10/14/2020  ? ?Lab Results  ?Component Value Date  ? WBC 5.6 10/14/2020  ? HGB 13.2 10/14/2020  ? HCT 40.9 10/14/2020  ? MCV 87.8 10/14/2020  ? PLT 251.0 10/14/2020  ? ?Lab Results  ?Component Value Date  ? NA 138 10/14/2020  ? K 3.9 10/14/2020  ? CO2 23 10/14/2020  ? GLUCOSE 82 10/14/2020  ? BUN 13 10/14/2020  ? CREATININE 1.11 10/14/2020  ? BILITOT 0.6 10/14/2020  ? ALKPHOS 71 10/14/2020  ? AST 15 10/14/2020  ? ALT 13 10/14/2020  ? PROT 7.4 10/14/2020  ? ALBUMIN 4.5 10/14/2020  ? CALCIUM 9.2 10/14/2020  ? GFR 61.88 10/14/2020  ? ?No results found for: HGBA1C ? ?  ?Assessment & Plan:  ? ?Problem List Items Addressed This Visit   ? ?  ? Other  ? Angio-edema  ?  rx prednisone taper ?Benadryl qhs  ?Epi pen rx sent to pharmacy ?Referral to allergist as over ten years  ?Unable to identify trigger, suspected food from restaurant ? ?  ?  ? Relevant Medications  ? EPINEPHrine 0.3 mg/0.3 mL IJ SOAJ injection  ? predniSONE (DELTASONE) 20 MG tablet  ? Other Relevant Orders  ? Ambulatory referral to Allergy  ? Allergic reaction - Primary  ?  Benadryl qhs prednisone taper ? ?  ?  ? Relevant Medications  ? EPINEPHrine 0.3 mg/0.3 mL IJ SOAJ injection  ? Other Relevant Orders  ? Ambulatory referral to Allergy  ? ? ?Meds ordered this encounter  ?Medications  ? EPINEPHrine 0.3 mg/0.3 mL IJ SOAJ injection  ?  Sig: Inject 0.3 mg into the muscle as needed for  anaphylaxis.  ?  Dispense:  1 each  ?  Refill:  0  ?  Order Specific Question:   Supervising Provider  ?  Answer:   BEDSOLE, AMY E [[id number]]  ? predniSONE (DELTASONE) 20 MG tablet  ?  Sig: Take two tablets daily for 3 days followed by one tablet daily for 4 days  ?  Dispense:  10 tablet  ?  Refill:  0  ?  Order Specific Question:   Supervising Provider  ?  Answer:   BEDSOLE, AMY E [[id number]]  ? ? ?Follow-up: Return if symptoms worsen or fail to improve see pcp.  ? ? ?Eugenia Pancoast, FNP ?

## 2021-09-18 ENCOUNTER — Ambulatory Visit
Admission: EM | Admit: 2021-09-18 | Discharge: 2021-09-18 | Disposition: A | Discharge status: Home / Self Care | Payer: BC Managed Care – PPO | Attending: Family Medicine | Admitting: Family Medicine

## 2021-09-18 ENCOUNTER — Encounter: Payer: Self-pay | Admitting: Emergency Medicine

## 2021-09-18 DIAGNOSIS — M549 Dorsalgia, unspecified: Secondary | ICD-10-CM

## 2021-09-18 DIAGNOSIS — M542 Cervicalgia: Secondary | ICD-10-CM

## 2021-09-18 MED ORDER — CYCLOBENZAPRINE HCL 5 MG PO TABS: 10.0000 mg | ORAL_TABLET | Freq: Two times a day (BID) | ORAL | 0 refills | Status: DC | PRN

## 2021-09-18 MED ORDER — DICLOFENAC SODIUM 25 MG PO TBEC: 50.0000 mg | DELAYED_RELEASE_TABLET | Freq: Two times a day (BID) | ORAL | 0 refills | Status: DC | PRN
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
## 2021-09-18 NOTE — Discharge Instructions (Signed)
Voltaren twice daily for the next 5 days regardless of symptoms.  Cyclobenzaprine which is a muscle relaxer you can take up to twice daily as needed.  Apply heat to neck and back as needed to reduce pain.  Follow-up with [facility] if symptoms worsen or do not improve.

## 2021-09-22 ENCOUNTER — Ambulatory Visit: Payer: Self-pay | Admitting: Allergy

## 2022-01-03 ENCOUNTER — Telehealth: Payer: Self-pay | Admitting: Family Medicine

## 2022-01-03 DIAGNOSIS — Z0000 Encounter for general adult medical examination without abnormal findings: Secondary | ICD-10-CM

## 2022-01-03 NOTE — Telephone Encounter (Signed)
-----   Message from Ellamae Sia sent at 12/28/2021  2:29 PM EDT ----- Regarding: Lab orders for Tuesday, 10.24.23 Patient is scheduled for CPX labs, please order future labs, Thanks , Karna Christmas

## 2022-01-04 ENCOUNTER — Other Ambulatory Visit (INDEPENDENT_AMBULATORY_CARE_PROVIDER_SITE_OTHER): Payer: BC Managed Care – PPO

## 2022-01-04 DIAGNOSIS — Z0000 Encounter for general adult medical examination without abnormal findings: Secondary | ICD-10-CM | POA: Diagnosis not present

## 2022-01-04 LAB — COMPREHENSIVE METABOLIC PANEL
ALT: 9 U/L (ref 0–35)
AST: 13 U/L (ref 0–37)
Albumin: 4.7 g/dL (ref 3.5–5.2)
Alkaline Phosphatase: 51 U/L (ref 39–117)
BUN: 15 mg/dL (ref 6–23)
CO2: 26 mEq/L (ref 19–32)
Calcium: 9.5 mg/dL (ref 8.4–10.5)
Chloride: 105 mEq/L (ref 96–112)
Creatinine, Ser: 0.93 mg/dL (ref 0.40–1.20)
GFR: 75.86 mL/min (ref >60.00)
Glucose, Bld: 85 mg/dL (ref 70–99)
Potassium: 4 mEq/L (ref 3.5–5.1)
Sodium: 138 mEq/L (ref 135–145)
Total Bilirubin: 0.6 mg/dL (ref 0.2–1.2)
Total Protein: 7.6 g/dL (ref 6.0–8.3)

## 2022-01-04 LAB — CBC WITH DIFFERENTIAL/PLATELET
Basophils Absolute: 0.1 10*3/uL (ref 0.0–0.1)
Basophils Relative: 1.1 % (ref 0.0–3.0)
Eosinophils Absolute: 0.2 10*3/uL (ref 0.0–0.7)
Eosinophils Relative: 4.5 % (ref 0.0–5.0)
HCT: 40.2 % (ref 36.0–46.0)
Hemoglobin: 13 g/dL (ref 12.0–15.0)
Lymphocytes Relative: 23.4 % (ref 12.0–46.0)
Lymphs Abs: 1.2 10*3/uL (ref 0.7–4.0)
MCHC: 32.5 g/dL (ref 30.0–36.0)
MCV: 87.3 fl (ref 78.0–100.0)
Monocytes Absolute: 0.3 10*3/uL (ref 0.1–1.0)
Monocytes Relative: 5.8 % (ref 3.0–12.0)
Neutro Abs: 3.4 10*3/uL (ref 1.4–7.7)
Neutrophils Relative %: 65.2 % (ref 43.0–77.0)
Platelets: 286 10*3/uL (ref 150.0–400.0)
RBC: 4.6 Mil/uL (ref 3.87–5.11)
RDW: 13.6 % (ref 11.5–15.5)
WBC: 5.2 10*3/uL (ref 4.0–10.5)

## 2022-01-04 LAB — LIPID PANEL
Cholesterol: 120 mg/dL (ref 0–200)
HDL: 42.7 mg/dL (ref >39.00)
LDL Cholesterol: 69 mg/dL (ref 0–99)
NonHDL: 77.78
Total CHOL/HDL Ratio: 3
Triglycerides: 42 mg/dL (ref 0.0–149.0)
VLDL: 8.4 mg/dL (ref 0.0–40.0)

## 2022-01-04 LAB — TSH: TSH: 1.01 u[IU]/mL (ref 0.35–5.50)

## 2022-01-11 ENCOUNTER — Ambulatory Visit (INDEPENDENT_AMBULATORY_CARE_PROVIDER_SITE_OTHER): Payer: BC Managed Care – PPO | Admitting: Family Medicine

## 2022-01-11 ENCOUNTER — Encounter: Payer: Self-pay | Admitting: Family Medicine

## 2022-01-11 VITALS — BP 138/90 | HR 83 | Temp 97.9°F | Ht 63.75 in | Wt 173.0 lb

## 2022-01-11 DIAGNOSIS — R030 Elevated blood-pressure reading, without diagnosis of hypertension: Secondary | ICD-10-CM | POA: Diagnosis not present

## 2022-01-11 DIAGNOSIS — Z0000 Encounter for general adult medical examination without abnormal findings: Secondary | ICD-10-CM

## 2022-01-11 DIAGNOSIS — B351 Tinea unguium: Secondary | ICD-10-CM

## 2022-01-11 DIAGNOSIS — D6861 Antiphospholipid syndrome: Secondary | ICD-10-CM

## 2022-01-11 DIAGNOSIS — E669 Obesity, unspecified: Secondary | ICD-10-CM

## 2022-01-11 NOTE — Assessment & Plan Note (Signed)
BP: (!) 138/90    Will monitor at home for 3 months  Disc goals Has fam hx  Plans to start exercise  F/u 3 mo

## 2022-01-11 NOTE — Assessment & Plan Note (Signed)
Reviewed health habits including diet and exercise and skin cancer prevention Reviewed appropriate screening tests for age  Also reviewed health mt list, fam hx and immunization status , as well as social and family history   See HPI Labs reviewed  Flu shot due-pt is getting over a cold and wants to get it later  Pap utd 12/2019 Nl breast exam today  Pt has her mammogram scheduled for 02/11/22 in [place]  Cholesterol is improved  Enc her to get back on track with exercise  Monitoring bp

## 2022-01-11 NOTE — Patient Instructions (Addendum)
Get your flu shot when you are over the cold    Monitor your BP over the next 3 months  Goal is top under 140 Bottom under 90   Eat healthy  Look at the [facility] eating plan  Exercise is most important   I will refer to podiatry for toe nail problem in [place]   If you don't get a call in 1-2 weeks let us know   Take care of yourself

## 2022-01-11 NOTE — Progress Notes (Signed)
Subjective:    Patient ID: Jillian Fleming, female    DOB: 12-15-1980, 41 y.o.   MRN: [id number]  HPI Here for health maintenance exam and to review chronic medical problems    Wt Readings from Last 3 Encounters:  01/11/22 173 lb (78.5 kg)  07/23/21 171 lb 8 oz (77.8 kg)  04/14/21 178 lb (80.7 kg)   29.93 kg/m  Getting over a cold  Whole family has it  Tested neg for covid   Doing well overall   Immunization History  Administered Date(s) Administered   H1N1 01/24/2008   Influenza,inj,Quad PF,6+ Mos 12/24/2013, 11/25/2014, 04/26/2016, 01/10/2020   PFIZER(Purple Top)SARS-COV-2 Vaccination 05/22/2019, 06/12/2019   Td 03/14/2008   Tdap 11/04/2014, 05/12/2020   Health Maintenance Due  Topic Date Due   Hepatitis C Screening  Never done   COVID-19 Vaccine (3 - Pfizer series) 08/07/2019   INFLUENZA VACCINE  10/12/2021    Flu shot : wants to put off until she is over her cold   Mammogram 12/2019-has appt dec 1 ([facility])  Self breast exam: no new lumps   Pap 12/2019- nl at gyn  Has not been this year     Took  daily asa 81 mg for anti phospholipid ab syndrome when pregnant    Blood pressure  BP Readings from Last 3 Encounters:  01/11/22 (!) 144/84  09/18/21 (!) 152/99  07/23/21 134/80   Mother had HTN   Not a lot of time for self care  After her hernia surgery was not motivated to exercise  Does eat fairly healthy   Cholesterol  Lab Results  Component Value Date   CHOL 120 01/04/2022   CHOL 141 10/14/2020   CHOL 113 08/06/2019   Lab Results  Component Value Date   HDL 42.70 01/04/2022   HDL 42.40 10/14/2020   HDL 35.50 (L) 08/06/2019   Lab Results  Component Value Date   LDLCALC 69 01/04/2022   LDLCALC 90 10/14/2020   LDLCALC 66 08/06/2019   Lab Results  Component Value Date   TRIG 42.0 01/04/2022   TRIG 45.0 10/14/2020   TRIG 60.0 08/06/2019   Lab Results  Component Value Date   CHOLHDL 3 01/04/2022   CHOLHDL 3 10/14/2020    CHOLHDL 3 08/06/2019   No results found for: "LDLDIRECT"   Other labs Results for orders placed or performed in visit on 01/04/22  CBC with Differential/Platelet  Result Value Ref Range   WBC 5.2 4.0 - 10.5 K/uL   RBC 4.60 3.87 - 5.11 Mil/uL   Hemoglobin 13.0 12.0 - 15.0 g/dL   HCT 53.6 64.4 - 03.4 %   MCV 87.3 78.0 - 100.0 fl   MCHC 32.5 30.0 - 36.0 g/dL   RDW 74.2 59.5 - 63.8 %   Platelets 286.0 150.0 - 400.0 K/uL   Neutrophils Relative % 65.2 43.0 - 77.0 %   Lymphocytes Relative 23.4 12.0 - 46.0 %   Monocytes Relative 5.8 3.0 - 12.0 %   Eosinophils Relative 4.5 0.0 - 5.0 %   Basophils Relative 1.1 0.0 - 3.0 %   Neutro Abs 3.4 1.4 - 7.7 K/uL   Lymphs Abs 1.2 0.7 - 4.0 K/uL   Monocytes Absolute 0.3 0.1 - 1.0 K/uL   Eosinophils Absolute 0.2 0.0 - 0.7 K/uL   Basophils Absolute 0.1 0.0 - 0.1 K/uL  Comprehensive metabolic panel  Result Value Ref Range   Sodium 138 135 - 145 mEq/L   Potassium 4.0 3.5 - 5.1  mEq/L   Chloride 105 96 - 112 mEq/L   CO2 26 19 - 32 mEq/L   Glucose, Bld 85 70 - 99 mg/dL   BUN 15 6 - 23 mg/dL   Creatinine, Ser 5.00 0.40 - 1.20 mg/dL   Total Bilirubin 0.6 0.2 - 1.2 mg/dL   Alkaline Phosphatase 51 39 - 117 U/L   AST 13 0 - 37 U/L   ALT 9 0 - 35 U/L   Total Protein 7.6 6.0 - 8.3 g/dL   Albumin 4.7 3.5 - 5.2 g/dL   GFR 93.81 >82.99 mL/min   Calcium 9.5 8.4 - 10.5 mg/dL  Lipid panel  Result Value Ref Range   Cholesterol 120 0 - 200 mg/dL   Triglycerides 37.1 0.0 - 149.0 mg/dL   HDL 69.67 >89.38 mg/dL   VLDL 8.4 0.0 - 10.1 mg/dL   LDL Cholesterol 69 0 - 99 mg/dL   Total CHOL/HDL Ratio 3    NonHDL 77.78   TSH  Result Value Ref Range   TSH 1.01 0.35 - 5.50 uIU/mL      Patient Active Problem List   Diagnosis Date Noted   Onychomycosis of great toe 01/11/2022   Angio-edema 07/23/2021   Allergic reaction 07/23/2021   Obesity (BMI 30-39.9) 08/09/2019   Allergic rhinitis 04/26/2016   Elevated blood pressure reading 04/10/2015   Adjustment  reaction with anxiety and depression 01/28/2014   Food allergy 02/27/2013   Routine general medical examination at a health care facility 02/19/2013   Anti-phospholipid antibody syndrome (HCC) 04/07/2009   Past Medical History:  Diagnosis Date   History of antiphospholipid syndrome    is on asa and has to have lovenox for pregnancy   History of blood transfusion 2005   after miscarriage   History of miscarriage    Past Surgical History:  Procedure Laterality Date   BIOPSY BREAST  2001   DILATION AND CURETTAGE OF UTERUS     HYSTEROSCOPY     surgery to remove scar tissue   Social History   Tobacco Use   Smoking status: Never   Smokeless tobacco: Never  Vaping Use   Vaping Use: Never used  Substance Use Topics   Alcohol use: No    Alcohol/week: 0.0 standard drinks of alcohol   Drug use: No   Family History  Problem Relation Age of Onset   Hypertension Mother    Prostate cancer Father    Allergies  Allergen Reactions   Other Hives    GINGER ALE   Pollen Extract Itching   Alcohol     Eyelids swell    Current Outpatient Medications on File Prior to Visit  Medication Sig Dispense Refill   cyclobenzaprine (FLEXERIL) 5 MG tablet Take 2 tablets (10 mg total) by mouth 2 (two) times daily between meals as needed for muscle spasms. 20 tablet 0   diclofenac (VOLTAREN) 25 MG EC tablet Take 2 tablets (50 mg total) by mouth 2 (two) times daily as needed. 20 tablet 0   EPINEPHrine 0.3 mg/0.3 mL IJ SOAJ injection Inject 0.3 mg into the muscle as needed for anaphylaxis. 1 each 0   fluticasone (FLONASE) 50 MCG/ACT nasal spray Place 2 sprays into both nostrils daily as needed for allergies or rhinitis. 16 g 11   No current facility-administered medications on file prior to visit.    Review of Systems  Constitutional:  Negative for activity change, appetite change, fatigue, fever and unexpected weight change.  HENT:  Positive for congestion, rhinorrhea and  voice change. Negative for  ear pain, sinus pressure and sore throat.   Eyes:  Negative for pain, redness and visual disturbance.  Respiratory:  Negative for cough, shortness of breath and wheezing.   Cardiovascular:  Negative for chest pain and palpitations.  Gastrointestinal:  Negative for abdominal pain, blood in stool, constipation and diarrhea.  Endocrine: Negative for polydipsia and polyuria.  Genitourinary:  Negative for dysuria, frequency and urgency.  Musculoskeletal:  Negative for arthralgias, back pain and myalgias.  Skin:  Negative for pallor and rash.  Allergic/Immunologic: Negative for environmental allergies.  Neurological:  Negative for dizziness, syncope and headaches.  Hematological:  Negative for adenopathy. Does not bruise/bleed easily.  Psychiatric/Behavioral:  Negative for decreased concentration and dysphoric mood. The patient is not nervous/anxious.        Objective:   Physical Exam Constitutional:      General: She is not in acute distress.    Appearance: Normal appearance. She is well-developed. She is not ill-appearing or diaphoretic.     Comments: Overweight   HENT:     Head: Normocephalic and atraumatic.     Right Ear: Tympanic membrane, ear canal and external ear normal.     Left Ear: Tympanic membrane, ear canal and external ear normal.     Nose: Congestion present.     Mouth/Throat:     Mouth: Mucous membranes are moist.     Pharynx: Oropharynx is clear. No posterior oropharyngeal erythema.     Comments: Mildly hoarse voice  Eyes:     General: No scleral icterus.    Extraocular Movements: Extraocular movements intact.     Conjunctiva/sclera: Conjunctivae normal.     Pupils: Pupils are equal, round, and reactive to light.  Neck:     Thyroid: No thyromegaly.     Vascular: No carotid bruit or JVD.  Cardiovascular:     Rate and Rhythm: Normal rate and regular rhythm.     Pulses: Normal pulses.     Heart sounds: Normal heart sounds.     No gallop.  Pulmonary:     Effort:  Pulmonary effort is normal. No respiratory distress.     Breath sounds: Normal breath sounds. No stridor. No wheezing or rhonchi.     Comments: Good air exch Chest:     Chest wall: No tenderness.  Abdominal:     General: Bowel sounds are normal. There is no distension or abdominal bruit.     Palpations: Abdomen is soft. There is no mass.     Tenderness: There is no abdominal tenderness.     Hernia: No hernia is present.  Genitourinary:    Comments: Breast exam: No mass, nodules, thickening, tenderness, bulging, retraction, inflamation, nipple discharge or skin changes noted.  No axillary or clavicular LA.     Musculoskeletal:        General: No tenderness. Normal range of motion.     Cervical back: Normal range of motion and neck supple. No rigidity. No muscular tenderness.     Right lower leg: No edema.     Left lower leg: No edema.     Comments: No kyphosis   Lymphadenopathy:     Cervical: No cervical adenopathy.  Skin:    General: Skin is warm and dry.     Coloration: Skin is not pale.     Findings: No erythema or rash.     Comments: Right great toe nail is pale/discolored distally  Not loose or tender  Not thickened   Neurological:  Mental Status: She is alert. Mental status is at baseline.     Cranial Nerves: No cranial nerve deficit.     Motor: No abnormal muscle tone.     Coordination: Coordination normal.     Gait: Gait normal.     Deep Tendon Reflexes: Reflexes are normal and symmetric. Reflexes normal.  Psychiatric:        Mood and Affect: Mood normal.        Cognition and Memory: Cognition and memory normal.           Assessment & Plan:   Problem List Items Addressed This Visit       Musculoskeletal and Integument   Onychomycosis of great toe   Relevant Orders   Ambulatory referral to Podiatry     Hematopoietic and Hemostatic   Anti-phospholipid antibody syndrome (HCC)    Took asa 81 mg during pregnancy        Other   Elevated blood pressure  reading    BP: (!) 138/90    Will monitor at home for 3 months  Disc goals Has fam hx  Plans to start exercise  F/u 3 mo      Obesity (BMI 30-39.9)    Discussed how this problem influences overall health and the risks it imposes  Reviewed plan for weight loss with lower calorie diet (via better food choices and also portion control or program like weight watchers) and exercise building up to or more than 30 minutes 5 days per week including some aerobic activity         Routine general medical examination at a health care facility - Primary    Reviewed health habits including diet and exercise and skin cancer prevention Reviewed appropriate screening tests for age  Also reviewed health mt list, fam hx and immunization status , as well as social and family history   See HPI Labs reviewed  Flu shot due-pt is getting over a cold and wants to get it later  Pap utd 12/2019 Nl breast exam today  Pt has her mammogram scheduled for 02/11/22 in [facility]  Cholesterol is improved  Enc her to get back on track with exercise  Monitoring bp

## 2022-01-11 NOTE — Assessment & Plan Note (Signed)
Took asa 81 mg during pregnancy

## 2022-01-11 NOTE — Assessment & Plan Note (Signed)
Discussed how this problem influences overall health and the risks it imposes  Reviewed plan for weight loss with lower calorie diet (via better food choices and also portion control or program like weight watchers) and exercise building up to or more than 30 minutes 5 days per week including some aerobic activity    

## 2022-02-11 LAB — HM MAMMOGRAPHY

## 2022-04-13 ENCOUNTER — Encounter: Payer: Self-pay | Admitting: Family Medicine

## 2022-04-13 ENCOUNTER — Ambulatory Visit: Payer: BC Managed Care – PPO | Admitting: Family Medicine

## 2022-04-13 VITALS — BP 148/96 | HR 96 | Temp 97.6°F | Ht 63.75 in | Wt 176.2 lb

## 2022-04-13 DIAGNOSIS — G43829 Menstrual migraine, not intractable, without status migrainosus: Secondary | ICD-10-CM

## 2022-04-13 DIAGNOSIS — I10 Essential (primary) hypertension: Secondary | ICD-10-CM

## 2022-04-13 DIAGNOSIS — D6861 Antiphospholipid syndrome: Secondary | ICD-10-CM

## 2022-04-13 MED ORDER — CHLORTHALIDONE 15 MG PO TABS: 15.0000 mg | ORAL_TABLET | Freq: Every day | ORAL | 1 refills | Status: DC

## 2022-04-13 NOTE — Assessment & Plan Note (Signed)
Pt did take asa when pregnant

## 2022-04-13 NOTE — Assessment & Plan Note (Signed)
Will discuss further at follow up  Not taking any hormones currently  Avoiding nsaid due to elevated bp  May discuss triptan as a rescue option at next visit

## 2022-04-13 NOTE — Progress Notes (Signed)
Subjective:    Patient ID: Jillian Fleming, female    DOB: October 06, 1980, 42 y.o.   MRN: [id number]  HPI Pt presents for f/u of HTN  Wt Readings from Last 3 Encounters:  04/13/22 176 lb 4 oz (79.9 kg)  01/11/22 173 lb (78.5 kg)  07/23/21 171 lb 8 oz (77.8 kg)   30.49 kg/m  Vitals:   04/13/22 0904  BP: (!) 148/96  Pulse: 96  Temp: 97.6 F (36.4 C)  SpO2: 98%     Last visit noted elevated bp  Disc plan to work on lifestyle change for 3 mo and then follow up   BP Readings from Last 3 Encounters:  04/13/22 (!) 148/96  01/11/22 (!) 138/90  09/18/21 (!) 152/99   Pulse Readings from Last 3 Encounters:  04/13/22 96  01/11/22 83  09/18/21 74   Bp is high at home  Having some headaches- this tends to be with menstrual cycle  Those are getting worse   Ankles do not swell   Periods are regular  No hormonal birth control   btl  Headaches (one per month)  Start on R and go to other side  Last up to 2 d  Can be throbbing  Some nausea , no vomiting    Not a lot of time for exercise but very active  Uses a program with [employer] 3 d per week   Not watching her diet at all  Needs to drink more water   Diclofenac is on med list-not taking  No nsaids now  Took low dose asa only when pregnant   Mother had h/o HTN   Lab Results  Component Value Date   CREATININE 0.93 01/04/2022   BUN 15 01/04/2022   NA 138 01/04/2022   K 4.0 01/04/2022   CL 105 01/04/2022   CO2 26 01/04/2022   Has a food allergy in past  She gets hives from stress also   Lab Results  Component Value Date   ALT 9 01/04/2022   AST 13 01/04/2022   ALKPHOS 51 01/04/2022   BILITOT 0.6 01/04/2022   Lab Results  Component Value Date   WBC 5.2 01/04/2022   HGB 13.0 01/04/2022   HCT 40.2 01/04/2022   MCV 87.3 01/04/2022   PLT 286.0 01/04/2022   Lab Results  Component Value Date   CHOL 120 01/04/2022   HDL 42.70 01/04/2022   LDLCALC 69 01/04/2022   TRIG 42.0 01/04/2022   CHOLHDL  3 01/04/2022   Patient Active Problem List   Diagnosis Date Noted   Menstrual headache 04/13/2022   Onychomycosis of great toe 01/11/2022   Angio-edema 07/23/2021   Allergic reaction 07/23/2021   Obesity (BMI 30-39.9) 08/09/2019   Allergic rhinitis 04/26/2016   Essential hypertension 04/10/2015   Adjustment reaction with anxiety and depression 01/28/2014   Food allergy 02/27/2013   Routine general medical examination at a health care facility 02/19/2013   Anti-phospholipid antibody syndrome ([place]) 04/07/2009   Past Medical History:  Diagnosis Date   History of antiphospholipid syndrome    is on asa and has to have lovenox for pregnancy   History of blood transfusion 2005   after miscarriage   History of miscarriage    Past Surgical History:  Procedure Laterality Date   BIOPSY BREAST  2001   DILATION AND CURETTAGE OF UTERUS     HYSTEROSCOPY     surgery to remove scar tissue   Social History   Tobacco Use  Smoking status: Never   Smokeless tobacco: Never  Vaping Use   Vaping Use: Never used  Substance Use Topics   Alcohol use: No    Alcohol/week: 0.0 standard drinks of alcohol   Drug use: No   Family History  Problem Relation Age of Onset   Hypertension Mother    Prostate cancer Father    Allergies  Allergen Reactions   Other Hives    GINGER ALE   Pollen Extract Itching   Alcohol     Eyelids swell    Current Outpatient Medications on File Prior to Visit  Medication Sig Dispense Refill   cyclobenzaprine (FLEXERIL) 5 MG tablet Take 2 tablets (10 mg total) by mouth 2 (two) times daily between meals as needed for muscle spasms. 20 tablet 0   EPINEPHrine 0.3 mg/0.3 mL IJ SOAJ injection Inject 0.3 mg into the muscle as needed for anaphylaxis. 1 each 0   fluticasone (FLONASE) 50 MCG/ACT nasal spray Place 2 sprays into both nostrils daily as needed for allergies or rhinitis. 16 g 11   No current facility-administered medications on file prior to visit.      Review of Systems  Constitutional:  Positive for fatigue. Negative for activity change, appetite change, fever and unexpected weight change.  HENT:  Negative for congestion, ear pain, rhinorrhea, sinus pressure and sore throat.   Eyes:  Negative for pain, redness and visual disturbance.  Respiratory:  Negative for cough, shortness of breath and wheezing.   Cardiovascular:  Negative for chest pain and palpitations.  Gastrointestinal:  Negative for abdominal pain, blood in stool, constipation and diarrhea.  Endocrine: Negative for polydipsia and polyuria.  Genitourinary:  Negative for dysuria, frequency and urgency.  Musculoskeletal:  Negative for arthralgias, back pain and myalgias.  Skin:  Negative for pallor and rash.  Allergic/Immunologic: Negative for environmental allergies.  Neurological:  Positive for headaches. Negative for dizziness and syncope.  Hematological:  Negative for adenopathy. Does not bruise/bleed easily.  Psychiatric/Behavioral:  Negative for decreased concentration and dysphoric mood. The patient is not nervous/anxious.        Objective:   Physical Exam Constitutional:      General: She is not in acute distress.    Appearance: Normal appearance. She is well-developed. She is obese. She is not ill-appearing or diaphoretic.  HENT:     Head: Normocephalic and atraumatic.     Right Ear: Tympanic membrane, ear canal and external ear normal.     Left Ear: Tympanic membrane, ear canal and external ear normal.     Nose: Nose normal. No congestion.     Mouth/Throat:     Mouth: Mucous membranes are moist.     Pharynx: Oropharynx is clear. No posterior oropharyngeal erythema.  Eyes:     General: No scleral icterus.    Extraocular Movements: Extraocular movements intact.     Conjunctiva/sclera: Conjunctivae normal.     Pupils: Pupils are equal, round, and reactive to light.  Neck:     Thyroid: No thyromegaly.     Vascular: No carotid bruit or JVD.  Cardiovascular:      Rate and Rhythm: Normal rate and regular rhythm.     Pulses: Normal pulses.     Heart sounds: Normal heart sounds.     No gallop.  Pulmonary:     Effort: Pulmonary effort is normal. No respiratory distress.     Breath sounds: Normal breath sounds. No wheezing or rales.     Comments: Good air exch Chest:  Chest wall: No tenderness.  Abdominal:     General: Bowel sounds are normal. There is no distension or abdominal bruit.     Palpations: Abdomen is soft. There is no hepatomegaly, splenomegaly, mass or pulsatile mass.     Tenderness: There is no abdominal tenderness.     Hernia: No hernia is present.  Genitourinary:    Comments: Breast exam: No mass, nodules, thickening, tenderness, bulging, retraction, inflamation, nipple discharge or skin changes noted.  No axillary or clavicular LA.     Musculoskeletal:        General: No tenderness. Normal range of motion.     Cervical back: Normal range of motion and neck supple. No rigidity. No muscular tenderness.     Right lower leg: No edema.     Left lower leg: No edema.     Comments: No kyphosis   Lymphadenopathy:     Cervical: No cervical adenopathy.  Skin:    General: Skin is warm and dry.     Coloration: Skin is not jaundiced or pale.     Findings: No erythema or rash.  Neurological:     Mental Status: She is alert. Mental status is at baseline.     Cranial Nerves: No cranial nerve deficit.     Sensory: No sensory deficit.     Motor: No weakness or abnormal muscle tone.     Coordination: Coordination normal.     Gait: Gait normal.     Deep Tendon Reflexes: Reflexes are normal and symmetric. Reflexes normal.  Psychiatric:        Mood and Affect: Mood normal.        Cognition and Memory: Cognition and memory normal.           Assessment & Plan:   Problem List Items Addressed This Visit       Cardiovascular and Mediastinum   Essential hypertension - Primary    New formal diagnosis Fam h/o HTN in mother  Bp is  high and trending up BP: (!) 148/96  Disc imp of lifestyle change DASH eating plan  Disc reasons to treat /to prevent end organ damage  Has h/o allergies in past with hives/would rather avoid ace/arb due to this  Px chlorthalidone  Enc her to stay hydrated Inst to update if side effects  F/u 2 wk for visit lab [facility] lower bp may also minimize HA      Relevant Medications   chlorthalidone (HYGROTEN) 15 MG tablet   Menstrual headache    Will discuss further at follow up  Not taking any hormones currently  Avoiding nsaid due to elevated bp  May discuss triptan as a rescue option at next visit       Relevant Medications   chlorthalidone (HYGROTEN) 15 MG tablet

## 2022-04-13 NOTE — Assessment & Plan Note (Signed)
New formal diagnosis Fam h/o HTN in mother  Bp is high and trending up BP: (!) 148/96  Disc imp of lifestyle change DASH eating plan  Disc reasons to treat /to prevent end organ damage  Has h/o allergies in past with hives/would rather avoid ace/arb due to this  Px chlorthalidone  Enc her to stay hydrated Inst to update if side effects  F/u 2 wk for visit lab [facility] lower bp may also minimize HA

## 2022-04-13 NOTE — Patient Instructions (Signed)
Start chlorthalidone 15 mg daily for blood pressure   If BP goes below 90/50 or you feel dizzy let us know and stop it If any intolerable side effects let us know   Keep an eye on blood pressure  Follow up in about 2 weeks for visit and labs and we will discuss headache more also   Keep exercising  Watch sodium in your diet

## 2022-04-19 ENCOUNTER — Encounter: Payer: Self-pay | Admitting: Pharmacist

## 2022-04-19 ENCOUNTER — Telehealth: Payer: Self-pay | Admitting: Family Medicine

## 2022-04-19 MED ORDER — AMLODIPINE BESYLATE 5 MG PO TABS: 5.0000 mg | ORAL_TABLET | Freq: Every day | ORAL | 0 refills | Status: DC

## 2022-04-19 NOTE — Telephone Encounter (Signed)
Pt notified of Dr. Tower's instructions and verbalized understanding  

## 2022-04-19 NOTE — Addendum Note (Signed)
Addended by: Loura Pardon A on: 04/19/2022 04:20 PM   Modules accepted: Orders

## 2022-04-19 NOTE — Telephone Encounter (Signed)
Pt said today BP was 137/104, yesterday 138/94, she can't say 100% if her cuff is accurate but it's been about the same readings as she we keep getting when she's here. Pt said she has no HA, but she does have a little blurry vision and gets lightheaded but those sxs are not all the time they come and go and they are both not very severe

## 2022-04-19 NOTE — Telephone Encounter (Signed)
Please get some details  What kind of numbers?  Is the cuff accurate? This is highly unusual  Thanks

## 2022-04-19 NOTE — Telephone Encounter (Signed)
Hold the chlorthalidone (put it away but don't get rid of it-may try later again )  I sent in amlodipine 5 mg  Take once daily  Alert me if side effects   Follow up as planned

## 2022-04-19 NOTE — Telephone Encounter (Signed)
Patient was seen on 04/13/22,and she was prescribed b/p medication chlorthalidone (HYGROTEN) 15 MG tablet,and it has made her b/p go up.

## 2022-04-27 ENCOUNTER — Ambulatory Visit: Payer: BC Managed Care – PPO | Admitting: Family Medicine

## 2022-04-27 ENCOUNTER — Encounter: Payer: Self-pay | Admitting: Family Medicine

## 2022-04-27 VITALS — BP 130/88 | HR 83 | Temp 97.8°F | Ht 63.75 in | Wt 176.4 lb

## 2022-04-27 DIAGNOSIS — I10 Essential (primary) hypertension: Secondary | ICD-10-CM | POA: Diagnosis not present

## 2022-04-27 MED ORDER — AMLODIPINE BESYLATE 5 MG PO TABS: 5.0000 mg | ORAL_TABLET | Freq: Every day | ORAL | 3 refills | Status: DC

## 2022-04-27 NOTE — Assessment & Plan Note (Signed)
Improved with amlodipine 5 mg    (after chlorthalidone was not tolerated, ? Inc bp) bp in fair control at this time  BP Readings from Last 1 Encounters:  04/27/22 130/88   No changes needed Most recent labs reviewed  Disc lifstyle change with low sodium diet and exercise  Will continue this  Pt will monitor intermittently

## 2022-04-27 NOTE — Patient Instructions (Signed)
Goal for blood pressure Under 140 top  Under 90 bottom   Take care of yourself  Watch sodium the best you can   Exercise when you can    Let us know if you have any problems /side effects

## 2022-04-27 NOTE — Progress Notes (Signed)
Subjective:    Patient ID: Jillian Fleming, female    DOB: 1980/11/06, 42 y.o.   MRN: [id number]  HPI Pt presents for f/u of HTN  Wt Readings from Last 3 Encounters:  04/27/22 176 lb 6 oz (80 kg)  04/13/22 176 lb 4 oz (79.9 kg)  01/11/22 173 lb (78.5 kg)   30.51 kg/m    HTN bp is stable today  No cp or palpitations or headaches or edema  No side effects to medicines  BP Readings from Last 3 Encounters:  04/27/22 136/88  04/13/22 (!) 148/96  01/11/22 (!) 138/90     Her cuff runs a bit higher diastolic 123456    Last visit px chlorthalidone 15- pt shinkg this actually made her bp go up instead of down We inst her to hold it  Px amlodipine 5 mg daily   No side effects at all Doing well   Bp at home came down to low 130s / low 90s     Lab Results  Component Value Date   CREATININE 0.93 01/04/2022   BUN 15 01/04/2022   NA 138 01/04/2022   K 4.0 01/04/2022   CL 105 01/04/2022   CO2 26 01/04/2022   GFR 75.86   Patient Active Problem List   Diagnosis Date Noted   Menstrual headache 04/13/2022   Onychomycosis of great toe 01/11/2022   Angio-edema 07/23/2021   Allergic reaction 07/23/2021   Obesity (BMI 30-39.9) 08/09/2019   Allergic rhinitis 04/26/2016   Essential hypertension 04/10/2015   Adjustment reaction with anxiety and depression 01/28/2014   Food allergy 02/27/2013   Routine general medical examination at a health care facility 02/19/2013   Anti-phospholipid antibody syndrome ([facility]) 04/07/2009   Past Medical History:  Diagnosis Date   History of antiphospholipid syndrome    is on asa and has to have lovenox for pregnancy   History of blood transfusion 2005   after miscarriage   History of miscarriage    Past Surgical History:  Procedure Laterality Date   BIOPSY BREAST  2001   DILATION AND CURETTAGE OF UTERUS     HYSTEROSCOPY     surgery to remove scar tissue   Social History   Tobacco Use   Smoking status: Never   Smokeless tobacco:  Never  Vaping Use   Vaping Use: Never used  Substance Use Topics   Alcohol use: No    Alcohol/week: 0.0 standard drinks of alcohol   Drug use: No   Family History  Problem Relation Age of Onset   Hypertension Mother    Prostate cancer Father    Allergies  Allergen Reactions   Other Hives    GINGER ALE   Pollen Extract Itching   Alcohol     Eyelids swell    Chlorthalidone     Caused bp to go up instead of down?    Current Outpatient Medications on File Prior to Visit  Medication Sig Dispense Refill   cyclobenzaprine (FLEXERIL) 5 MG tablet Take 2 tablets (10 mg total) by mouth 2 (two) times daily between meals as needed for muscle spasms. 20 tablet 0   EPINEPHrine 0.3 mg/0.3 mL IJ SOAJ injection Inject 0.3 mg into the muscle as needed for anaphylaxis. 1 each 0   fluticasone (FLONASE) 50 MCG/ACT nasal spray Place 2 sprays into both nostrils daily as needed for allergies or rhinitis. 16 g 11   No current facility-administered medications on file prior to visit.     Review of  Systems  Constitutional:  Negative for activity change, appetite change, fatigue, fever and unexpected weight change.  HENT:  Negative for congestion, ear pain, rhinorrhea, sinus pressure and sore throat.   Eyes:  Negative for pain, redness and visual disturbance.  Respiratory:  Negative for cough, shortness of breath and wheezing.   Cardiovascular:  Negative for chest pain and palpitations.  Gastrointestinal:  Negative for abdominal pain, blood in stool, constipation and diarrhea.  Endocrine: Negative for polydipsia and polyuria.  Genitourinary:  Negative for dysuria, frequency and urgency.  Musculoskeletal:  Negative for arthralgias, back pain and myalgias.  Skin:  Negative for pallor and rash.  Allergic/Immunologic: Negative for environmental allergies.  Neurological:  Negative for dizziness, syncope and headaches.  Hematological:  Negative for adenopathy. Does not bruise/bleed easily.   Psychiatric/Behavioral:  Negative for decreased concentration and dysphoric mood. The patient is not nervous/anxious.        Objective:   Physical Exam Constitutional:      General: She is not in acute distress.    Appearance: Normal appearance. She is well-developed. She is obese. She is not ill-appearing or diaphoretic.  HENT:     Head: Normocephalic and atraumatic.  Eyes:     Conjunctiva/sclera: Conjunctivae normal.     Pupils: Pupils are equal, round, and reactive to light.  Neck:     Thyroid: No thyromegaly.     Vascular: No carotid bruit or JVD.  Cardiovascular:     Rate and Rhythm: Normal rate and regular rhythm.     Heart sounds: Normal heart sounds.     No gallop.  Pulmonary:     Effort: Pulmonary effort is normal. No respiratory distress.     Breath sounds: Normal breath sounds. No wheezing or rales.  Abdominal:     General: There is no distension or abdominal bruit.  Musculoskeletal:     Cervical back: Normal range of motion and neck supple.     Right lower leg: No edema.     Left lower leg: No edema.  Lymphadenopathy:     Cervical: No cervical adenopathy.  Skin:    General: Skin is warm and dry.     Coloration: Skin is not pale.     Findings: No rash.  Neurological:     Mental Status: She is alert.     Coordination: Coordination normal.     Deep Tendon Reflexes: Reflexes are normal and symmetric. Reflexes normal.  Psychiatric:        Mood and Affect: Mood normal.           Assessment & Plan:   Problem List Items Addressed This Visit       Cardiovascular and Mediastinum   Essential hypertension - Primary    Improved with amlodipine 5 mg    (after chlorthalidone was not tolerated, ? Inc bp) bp in fair control at this time  BP Readings from Last 1 Encounters:  04/27/22 130/88  No changes needed Most recent labs reviewed  Disc lifstyle change with low sodium diet and exercise  Will continue this  Pt will monitor intermittently         Relevant Medications   amLODipine (NORVASC) 5 MG tablet

## 2022-08-18 ENCOUNTER — Ambulatory Visit: Payer: BC Managed Care – PPO | Admitting: Family

## 2023-01-03 ENCOUNTER — Telehealth: Payer: Self-pay | Admitting: Family Medicine

## 2023-01-03 DIAGNOSIS — I10 Essential (primary) hypertension: Secondary | ICD-10-CM

## 2023-01-03 NOTE — Telephone Encounter (Signed)
-----   Message from Alvina Chou sent at 12/19/2022  3:52 PM EDT ----- Regarding: Lab orders for Jillian Fleming, 10.25.24 Patient is scheduled for CPX labs, please order future labs, Thanks , Camelia Eng

## 2023-01-06 ENCOUNTER — Other Ambulatory Visit (INDEPENDENT_AMBULATORY_CARE_PROVIDER_SITE_OTHER): Payer: BC Managed Care – PPO

## 2023-01-06 DIAGNOSIS — I10 Essential (primary) hypertension: Secondary | ICD-10-CM

## 2023-01-06 LAB — LIPID PANEL
Cholesterol: 125 mg/dL (ref 0–200)
HDL: 44.8 mg/dL (ref >39.00)
LDL Cholesterol: 73 mg/dL (ref 0–99)
NonHDL: 80.3
Total CHOL/HDL Ratio: 3
Triglycerides: 38 mg/dL (ref 0.0–149.0)
VLDL: 7.6 mg/dL (ref 0.0–40.0)

## 2023-01-06 LAB — COMPREHENSIVE METABOLIC PANEL
ALT: 8 U/L (ref 0–35)
AST: 13 U/L (ref 0–37)
Albumin: 4.5 g/dL (ref 3.5–5.2)
Alkaline Phosphatase: 49 U/L (ref 39–117)
BUN: 12 mg/dL (ref 6–23)
CO2: 26 meq/L (ref 19–32)
Calcium: 9.6 mg/dL (ref 8.4–10.5)
Chloride: 105 meq/L (ref 96–112)
Creatinine, Ser: 0.93 mg/dL (ref 0.40–1.20)
GFR: 75.32 mL/min (ref >60.00)
Glucose, Bld: 93 mg/dL (ref 70–99)
Potassium: 4 meq/L (ref 3.5–5.1)
Sodium: 141 meq/L (ref 135–145)
Total Bilirubin: 0.5 mg/dL (ref 0.2–1.2)
Total Protein: 7.2 g/dL (ref 6.0–8.3)

## 2023-01-06 LAB — CBC WITH DIFFERENTIAL/PLATELET
Basophils Absolute: 0 10*3/uL (ref 0.0–0.1)
Basophils Relative: 0.7 % (ref 0.0–3.0)
Eosinophils Absolute: 0.3 10*3/uL (ref 0.0–0.7)
Eosinophils Relative: 5.8 % — ABNORMAL HIGH (ref 0.0–5.0)
HCT: 39.8 % (ref 36.0–46.0)
Hemoglobin: 12.7 g/dL (ref 12.0–15.0)
Lymphocytes Relative: 22 % (ref 12.0–46.0)
Lymphs Abs: 1.3 10*3/uL (ref 0.7–4.0)
MCHC: 31.9 g/dL (ref 30.0–36.0)
MCV: 89.1 fL (ref 78.0–100.0)
Monocytes Absolute: 0.4 10*3/uL (ref 0.1–1.0)
Monocytes Relative: 7 % (ref 3.0–12.0)
Neutro Abs: 3.8 10*3/uL (ref 1.4–7.7)
Neutrophils Relative %: 64.5 % (ref 43.0–77.0)
Platelets: 302 10*3/uL (ref 150.0–400.0)
RBC: 4.47 Mil/uL (ref 3.87–5.11)
RDW: 13.8 % (ref 11.5–15.5)
WBC: 5.9 10*3/uL (ref 4.0–10.5)

## 2023-01-06 LAB — TSH: TSH: 0.85 u[IU]/mL (ref 0.35–5.50)

## 2023-01-13 ENCOUNTER — Encounter: Payer: Self-pay | Admitting: Family Medicine

## 2023-01-13 ENCOUNTER — Ambulatory Visit (INDEPENDENT_AMBULATORY_CARE_PROVIDER_SITE_OTHER): Payer: BC Managed Care – PPO | Admitting: Family Medicine

## 2023-01-13 VITALS — BP 118/80 | HR 80 | Temp 98.4°F | Ht 63.5 in | Wt 183.4 lb

## 2023-01-13 DIAGNOSIS — I10 Essential (primary) hypertension: Secondary | ICD-10-CM | POA: Diagnosis not present

## 2023-01-13 DIAGNOSIS — T7840XA Allergy, unspecified, initial encounter: Secondary | ICD-10-CM

## 2023-01-13 DIAGNOSIS — E669 Obesity, unspecified: Secondary | ICD-10-CM

## 2023-01-13 DIAGNOSIS — D6861 Antiphospholipid syndrome: Secondary | ICD-10-CM | POA: Diagnosis not present

## 2023-01-13 DIAGNOSIS — Z800 Family history of malignant neoplasm of digestive organs: Secondary | ICD-10-CM | POA: Diagnosis not present

## 2023-01-13 DIAGNOSIS — Z0000 Encounter for general adult medical examination without abnormal findings: Secondary | ICD-10-CM

## 2023-01-13 DIAGNOSIS — T783XXA Angioneurotic edema, initial encounter: Secondary | ICD-10-CM

## 2023-01-13 NOTE — Patient Instructions (Addendum)
Let me know where to send your mammogram order -will be due in December   I put the referral in for GI to discuss colon cancer screening in light of family history  I put the referral in  Please call for appointment  [facility]  [phone]    For bone health  Get some vitamin D3 and take 2000 international units daily   When you start exercising  Add some strength training to your routine, this is important for bone and brain health and can reduce your risk of falls and help your body use insulin properly and regulate weight  Light weights, exercise bands , and internet videos are a good way to start  Yoga (chair or regular), machines , floor exercises or a gym with machines are also good options

## 2023-01-13 NOTE — Assessment & Plan Note (Signed)
Brother in 73s Father in 2s   Referred to GI to discuss colon cancer screening

## 2023-01-13 NOTE — Progress Notes (Unsigned)
Subjective:    Patient ID: Jillian Fleming, female    DOB: June 15, 1980, 42 y.o.   MRN: [id number]  HPI  Here for health maintenance exam and to review chronic medical problems   Wt Readings from Last 3 Encounters:  01/13/23 183 lb 6.4 oz (83.2 kg)  04/27/22 176 lb 6 oz (80 kg)  04/13/22 176 lb 4 oz (79.9 kg)   31.98 kg/m  Vitals:   01/13/23 0954  BP: 118/80  Pulse: 80  Temp: 98.4 F (36.9 C)  SpO2: 99%    Immunization History  Administered Date(s) Administered   H1N1 01/24/2008   Influenza,inj,Quad PF,6+ Mos 12/24/2013, 11/25/2014, 04/26/2016, 01/10/2020   PFIZER(Purple Top)SARS-COV-2 Vaccination 05/22/2019, 06/12/2019   Td 03/14/2008   Tdap 11/04/2014, 05/12/2020    Health Maintenance Due  Topic Date Due   Hepatitis C Screening  Never done   MAMMOGRAM  02/15/2022   COVID-19 Vaccine (3 - 2023-24 season) 11/13/2022   Flu vaccine - declines   Mammogram 02/2022 in care everywhere  Self breast exam- no lumps -no changes   Gyn health Pap 12/2019 normal at gyn  Normal /no HPV   Has bad headaches before and week of period  Periods are regular  Had BTL   Needs breast exam    Colon cancer screening -will discuss at age 89  Brother was diagnosed with cancer - liver/ colon/lung - he is 53 ? What was primary  Father had colon cancer at in his 68s     Bone health   Falls- none  Fractures-none  Supplements -none  Exercise - needs to do more/ very busy schedule  Ultimate goal is 30 minutes per day    Mood    04/27/2022    8:53 AM 04/13/2022    9:47 AM 01/11/2022    9:45 AM 10/28/2020   11:10 AM 08/09/2019   10:41 AM  Depression screen PHQ 2/9  Decreased Interest 0 0 0 0 1  Down, Depressed, Hopeless 0 0 0 0 0  PHQ - 2 Score 0 0 0 0 1  Altered sleeping 0 1 0 0 0  Tired, decreased energy 1 1 1 1 1   Change in appetite 1 1 1 1 1   Feeling bad or failure about yourself  0 0 0 0 0  Trouble concentrating 1 0 0 1 1  Moving slowly or fidgety/restless 0 0 0  0 0  Suicidal thoughts 0 0 0 0 0  PHQ-9 Score 3 3 2 3 4   Difficult doing work/chores Not difficult at all Not difficult at all   Somewhat difficult   HTN bp is stable today  No cp or palpitations or headaches or edema  No side effects to medicines  BP Readings from Last 3 Encounters:  01/13/23 118/80  04/27/22 130/88  04/13/22 (!) 148/96    Amlodipine 5 mg daily   Lab Results  Component Value Date   NA 141 01/06/2023   K 4.0 01/06/2023   CO2 26 01/06/2023   GLUCOSE 93 01/06/2023   BUN 12 01/06/2023   CREATININE 0.93 01/06/2023   CALCIUM 9.6 01/06/2023   GFR 75.32 01/06/2023   Lab Results  Component Value Date   ALT 8 01/06/2023   AST 13 01/06/2023   ALKPHOS 49 01/06/2023   BILITOT 0.5 01/06/2023   Lab Results  Component Value Date   WBC 5.9 01/06/2023   HGB 12.7 01/06/2023   HCT 39.8 01/06/2023   MCV 89.1 01/06/2023   PLT 302.0  01/06/2023   Lab Results  Component Value Date   TSH 0.85 01/06/2023   Cholesterol Lab Results  Component Value Date   CHOL 125 01/06/2023   CHOL 120 01/04/2022   CHOL 141 10/14/2020   Lab Results  Component Value Date   HDL 44.80 01/06/2023   HDL 42.70 01/04/2022   HDL 42.40 10/14/2020   Lab Results  Component Value Date   LDLCALC 73 01/06/2023   LDLCALC 69 01/04/2022   LDLCALC 90 10/14/2020   Lab Results  Component Value Date   TRIG 38.0 01/06/2023   TRIG 42.0 01/04/2022   TRIG 45.0 10/14/2020   Lab Results  Component Value Date   CHOLHDL 3 01/06/2023   CHOLHDL 3 01/04/2022   CHOLHDL 3 10/14/2020   No results found for: "LDLDIRECT"    Patient Active Problem List   Diagnosis Date Noted   Menstrual headache 04/13/2022   Onychomycosis of great toe 01/11/2022   Obesity (BMI 30-39.9) 08/09/2019   Allergic rhinitis 04/26/2016   Essential hypertension 04/10/2015   Adjustment reaction with anxiety and depression 01/28/2014   Food allergy 02/27/2013   Routine general medical examination at a health care  facility 02/19/2013   Anti-phospholipid antibody syndrome ([facility]) 04/07/2009   Past Medical History:  Diagnosis Date   History of antiphospholipid syndrome    is on asa and has to have lovenox for pregnancy   History of blood transfusion 2005   after miscarriage   History of miscarriage    Past Surgical History:  Procedure Laterality Date   BIOPSY BREAST  2001   DILATION AND CURETTAGE OF UTERUS     HYSTEROSCOPY     surgery to remove scar tissue   Social History   Tobacco Use   Smoking status: Never   Smokeless tobacco: Never  Vaping Use   Vaping status: Never Used  Substance Use Topics   Alcohol use: No    Alcohol/week: 0.0 standard drinks of alcohol   Drug use: No   Family History  Problem Relation Age of Onset   Hypertension Mother    Prostate cancer Father    Allergies  Allergen Reactions   Other Hives    GINGER ALE   Pollen Extract Itching   Alcohol     Eyelids swell    Chlorthalidone     Caused bp to go up instead of down?    Current Outpatient Medications on File Prior to Visit  Medication Sig Dispense Refill   amLODipine (NORVASC) 5 MG tablet Take 1 tablet (5 mg total) by mouth daily. 90 tablet 3   EPINEPHrine 0.3 mg/0.3 mL IJ SOAJ injection Inject 0.3 mg into the muscle as needed for anaphylaxis. 1 each 0   fluticasone (FLONASE) 50 MCG/ACT nasal spray Place 2 sprays into both nostrils daily as needed for allergies or rhinitis. 16 g 11   No current facility-administered medications on file prior to visit.    Review of Systems     Objective:   Physical Exam        Assessment & Plan:   Problem List Items Addressed This Visit       Cardiovascular and Mediastinum   Essential hypertension     Other   Routine general medical examination at a health care facility - Primary   Other Visit Diagnoses     Allergic reaction, initial encounter       Angioedema, initial encounter

## 2023-01-15 ENCOUNTER — Encounter: Payer: Self-pay | Admitting: Family Medicine

## 2023-01-15 NOTE — Assessment & Plan Note (Signed)
Reviewed health habits including diet and exercise and skin cancer prevention Reviewed appropriate screening tests for age  Also reviewed health mt list, fam hx and immunization status , as well as social and family history   See HPI Labs reviewed and ordered Declines flu vaccine  Mammogram due in dec (goes in [place]) -will call for order when needed  Pap utd 12/2019-due in a year  Plans to return to discuss menstrual headache Strong family history of colon cancer (brother at 24 and father in 44s) Referral done for GI to discuss screening options and plan / likely colonoscopy  Discussed fall prevention, supplements and exercise for bone density   PHQ of 3 mainly to to fatigue and sleep issues/busy schedule

## 2023-01-15 NOTE — Assessment & Plan Note (Signed)
Improved with amlodipine 5 mg    (after chlorthalidone was not tolerated, ? Inc bp) bp in fair control at this time  BP Readings from Last 1 Encounters:  01/13/23 118/80   No changes needed Continues amlodipine 5 mg daily Most recent labs reviewed  Disc lifstyle change with low sodium diet and exercise  Will continue this  Pt will monitor intermittently

## 2023-01-15 NOTE — Assessment & Plan Note (Signed)
Pt did take asa when pregnant   No symptoms or clinical changes

## 2023-01-15 NOTE — Assessment & Plan Note (Signed)
Discussed how this problem influences overall health and the risks it imposes  Reviewed plan for weight loss with lower calorie diet (via better food choices (lower glycemic and portion control) along with exercise building up to or more than 30 minutes 5 days per week including some aerobic activity and strength training   Schedule makes it hard to fit in exercise

## 2023-01-25 ENCOUNTER — Encounter: Payer: Self-pay | Admitting: *Deleted

## 2023-01-31 ENCOUNTER — Telehealth: Payer: Self-pay | Admitting: Family Medicine

## 2023-01-31 DIAGNOSIS — R928 Other abnormal and inconclusive findings on diagnostic imaging of breast: Secondary | ICD-10-CM

## 2023-01-31 NOTE — Telephone Encounter (Signed)
Patient needs an order for a diagnostic mammogram to be placed for her, needs it to include bilateral with ultrasound. Preferred location would be [facility] in [place], [place]. Fax # is [phone]

## 2023-01-31 NOTE — Telephone Encounter (Signed)
For what?  Last report we have recommended annual mammogram (had dx mm and [place] last dec?)  Thanks  Need diagnosis Jillian Fleming

## 2023-02-01 DIAGNOSIS — R928 Other abnormal and inconclusive findings on diagnostic imaging of breast: Secondary | ICD-10-CM | POA: Insufficient documentation

## 2023-02-01 NOTE — Addendum Note (Signed)
Addended by: Roxy Manns A on: 02/01/2023 12:33 PM   Modules accepted: Orders

## 2023-02-01 NOTE — Telephone Encounter (Signed)
Called patient she states she was advised that she needs the same thing she had last year. I have added below the notation at bottom of Mammogram.

## 2023-02-01 NOTE — Telephone Encounter (Signed)
The orders are done  External   Will need to be released and faxed  Thanks

## 2023-02-01 NOTE — Telephone Encounter (Signed)
I have faxed order to number as requested. My chart sent to patient to let know she can call to set up.

## 2023-03-13 LAB — HM MAMMOGRAPHY

## 2023-06-06 ENCOUNTER — Other Ambulatory Visit: Payer: Self-pay | Admitting: Family Medicine

## 2023-09-06 ENCOUNTER — Encounter (INDEPENDENT_AMBULATORY_CARE_PROVIDER_SITE_OTHER): Payer: Self-pay | Admitting: Family Medicine

## 2023-09-06 ENCOUNTER — Telehealth: Payer: Self-pay

## 2023-09-06 ENCOUNTER — Ambulatory Visit (INDEPENDENT_AMBULATORY_CARE_PROVIDER_SITE_OTHER): Payer: Self-pay | Admitting: Family Medicine

## 2023-09-06 ENCOUNTER — Other Ambulatory Visit: Payer: Self-pay

## 2023-09-06 VITALS — BP 138/88 | HR 84 | Temp 98.7°F | Ht 63.5 in | Wt 179.0 lb

## 2023-09-06 DIAGNOSIS — Z6831 Body mass index (BMI) 31.0-31.9, adult: Secondary | ICD-10-CM

## 2023-09-06 DIAGNOSIS — E66811 Obesity, class 1: Secondary | ICD-10-CM | POA: Diagnosis not present

## 2023-09-06 DIAGNOSIS — Z800 Family history of malignant neoplasm of digestive organs: Secondary | ICD-10-CM

## 2023-09-06 DIAGNOSIS — I10 Essential (primary) hypertension: Secondary | ICD-10-CM

## 2023-09-06 DIAGNOSIS — Z0289 Encounter for other administrative examinations: Secondary | ICD-10-CM

## 2023-09-06 DIAGNOSIS — Z1211 Encounter for screening for malignant neoplasm of colon: Secondary | ICD-10-CM

## 2023-09-06 MED ORDER — NA SULFATE-K SULFATE-MG SULF 17.5-3.13-1.6 GM/177ML PO SOLN: 1.0000 | Freq: Once | ORAL | 0 refills | Status: AC

## 2023-09-06 NOTE — Progress Notes (Signed)
 Barnie DOROTHA Jenkins, DO, ABFM, ABOM Bariatric physician [address] [place], [place], [place] [address] Office: [phone]  /  Fax: [phone]     Initial Evaluation:  Jillian Fleming was seen in clinic today to evaluate for obesity. She is interested in losing weight to improve overall health and reduce the risk of weight related complications. She presents today to review program treatment options, initial physical assessment, and evaluation.      She was referred by: PCP  When asked how has your weight affected you? She states: Contributed to orthopedic problems or mobility issues and Having poor endurance  Contributing factors to her weight change: consumption of processed foods, moderate to high levels of stress, reduced physical activity, and need for convenience due to lack of time.   Some associated conditions: Hypertension. She also reports a hx of adjustment disorder of anxiety and depression around 01/2014, following a miscarriage.  Current nutrition plan: None She notes that she tends to eat what her kids eat and at whatever time she is able to.  Current level of physical activity: None Has not been able to exercise as she once was. States she used to play basketball and ran track when she was younger.   Current or previous pharmacotherapy: None  Pt is not interested in medications; has her hesitations.   Response to medication: Never tried medications   Barriers to weight loss that patient expresses a concern about today: lack of time for self-care, multiple competing priorities, work schedule, and moderate to high levels of stress.    Past Medical History:  Diagnosis Date   History of antiphospholipid syndrome    is on asa and has to have lovenox for pregnancy   History of blood transfusion 2005   after miscarriage   History of miscarriage     Current Outpatient Medications  Medication Instructions   amLODipine  (NORVASC ) 5 mg, Oral, Daily   EPINEPHrine   (EPI-PEN) 0.3 mg, Intramuscular, As needed   fluticasone  (FLONASE ) 50 MCG/ACT nasal spray 2 sprays, Each Nare, Daily PRN   Na Sulfate-K Sulfate-Mg Sulfate concentrate (SUPREP) 17.5-3.13-1.6 GM/177ML SOLN 354 mLs, Oral,  Once     Allergies  Allergen Reactions   Other Hives    GINGER ALE   Pollen Extract Itching   Alcohol     Eyelids swell    Chlorthalidone      Caused bp to go up instead of down?      Past Surgical History:  Procedure Laterality Date   BIOPSY BREAST  2001   DILATION AND CURETTAGE OF UTERUS     HYSTEROSCOPY     surgery to remove scar tissue     Family History  Problem Relation Age of Onset   Hypertension Mother    Colon cancer Father    Prostate cancer Father    Colon cancer Brother      Objective:  BP 138/88   Pulse 84   Temp 98.7 F (37.1 C)   Ht 5' 3.5 (1.613 m)   Wt 179 lb (81.2 kg)   SpO2 100%   BMI 31.21 kg/m  She was weighed on the bioimpedance scale: Body mass index is 31.21 kg/m.   Visceral Fat %:  7% , Body Fat %:   33.9%   Weight Lost Since Last Visit: 0lb  Weight Gained Since Last Visit: 0lb    Vitals Temp: 98.7 F (37.1 C) BP: 138/88 Pulse Rate: 84 SpO2: 100 %   Anthropometric Measurements Height: 5' 3.5 (1.613 m) Weight:  179 lb (81.2 kg) BMI (Calculated): 31.21 Weight at Last Visit: 0lb Weight Lost Since Last Visit: 0lb Weight Gained Since Last Visit: 0lb Starting Weight: 0lb Total Weight Loss (lbs): 0 lb (0 kg)   Body Composition  Body Fat %: 33.9 % Fat Mass (lbs): 61 lbs Muscle Mass (lbs): 112.8 lbs Total Body Water (lbs): 75.2 lbs Visceral Fat Rating : 7   Other Clinical Data Fasting: No Labs: no Comments: info session     General: Well Developed, well nourished, and in no acute distress.  HEENT: Normocephalic, atraumatic; EOMI, sclerae are anicteric. Skin: Warm and dry, good turgor Chest:  Normal excursion, shape, no gross ABN Respiratory: No conversational dyspnea; speaking in full  sentences NeuroM-Sk:  Normal gross ROM * 4 extremities  Psych: A and O *3, insight adequate, mood- full    Assessment and Plan:   FOR THE DISEASE OF OBESITY: Class 1 obesity with body mass index (BMI) of 31.0 to 31.9 in adult, unspecified obesity type, unspecified whether serious comorbidity present BMI 31.0-31.9,adult -- Current BMI 31.21 Assessment & Plan: We reviewed anthropometrics, biometrics, associated medical conditions and contributing factors with patient. Violia would benefit from a medically tailored reduced calorie nutrional plan based on his REE (resting energy expenditure), which will be determined by indirect calorimetry.  We will also assess for cardiometabolic risk and nutritional derangements via fasting labs at intake appointment.    Obesity Treatment / Action Plan:   She was weighed on the bioimpedance scale and results were discussed and documented in the synopsis.   Zain A Fickel will complete provided nutritional and psychosocial assessment questionnaire before the next appointment.  She will be scheduled for indirect calorimetry to determine resting energy expenditure in a fasting state.  This will allow us  to create a reduced calorie, high-protein meal plan to promote loss of fat mass while preserving muscle mass.  We will also assess for cardiometabolic risk and nutritional derangements via an ECG and fasting serologies at her next appointment.  She was encouraged to work on amassing support from family and friends to begin their weight loss journey.   Work on eliminating or reducing the presence of highly processed, poorly nutritious, calorie-dense foods in the home.   Obesity Education Performed Today:  Patient was counseled on nutritional approaches to weight loss and benefits of reducing processed foods and consuming plant-based foods and high quality protein as part of nutritional weight management program.   We discussed the importance of long  term lifestyle changes which include nutrition, exercise and behavioral modifications as well as the importance of customizing this to her specific health and social needs.   We discussed the benefits of reaching a healthier weight to alleviate the symptoms of existing conditions and reduce the risks of the biomechanical, metabolic and psychological effects of obesity.  Was counseled on the health benefits of losing 5%-10% of total body weight.  Was counseled on our cognitive behavorial therapy program, lead by our bariatric psychologist, who focuses on emotional eating and creating positive behavorial change.  Was counseled on bariatric pharmacotherapy and how this may be used as an adjunct in their weight management    Tyah appears to be in the action stage of change and states they are ready to start intensive lifestyle modifications and behavioral modifications.  It was recommended that she follow up in the next 1-2 weeks to review the above steps, and to continue with treatment of their chronic disease state of obesity   FOR OTHER  CONDITIONS RELATED TO THE DISEASE OF OBESITY:   Essential hypertension Assessment & Plan: BP Readings from Last 3 Encounters:  09/06/23 138/88  01/13/23 118/80  04/27/22 130/88   She was put on Amlodipine  5 mg once daily with good compliance and tolerance. Pt is asymptomatic today. Throughout her pregnancy her BP was overall stable and at goal, however, she experienced post-partum blood pressure fluctuations following the delivery of her 4th child.  She was started on antihypertensive treatment in 12/2022. Denies any hx of kidney damage, proteinuria, HLD, MI, or stroke.   Reviewed ideal BP goal of less than 130/80. Encouraged pt to continue her current antihypertensive regimen. Blood pressures are expected to improve with weight loss. Discussed the benefits of a heart-healthy diet that she would be prescribed if she decides to join the program.      Attestations:   I, Vernell Forest, acting as a medical scribe for Barnie Jenkins, DO., have compiled all relevant documentation for today's office visit on behalf of Barnie Jenkins, DO, while in the presence of [employer], DO.  I have reviewed the above documentation for accuracy and completeness, and I agree with the above. Barnie JINNY Jenkins, D.O.  The 21st Century Cures Act was signed into law in 2016 which includes the topic of electronic health records.  This provides immediate access to information in MyChart.  This includes consultation notes, operative notes, office notes, lab results and pathology reports.  If you have any questions about what you read please let us  know at your next visit so we can discuss your concerns and take corrective action if need be.  We are right here with you!

## 2023-09-06 NOTE — Telephone Encounter (Signed)
 Gastroenterology Pre-Procedure Review  Request Date: 10/26/23 Requesting Physician: Dr. Jinny  PATIENT REVIEW QUESTIONS: The patient responded to the following health history questions as indicated:    1. Are you having any GI issues? no 2. Do you have a personal history of Polyps? no 3. Do you have a family history of Colon Cancer or Polyps? yes (father and brother colon cancer) 4. Diabetes Mellitus? no 5. Joint replacements in the past 12 months?no 6. Major health problems in the past 3 months?no 7. Any artificial heart valves, MVP, or defibrillator?no    MEDICATIONS & ALLERGIES:    Patient reports the following regarding taking any anticoagulation/antiplatelet therapy:   Plavix, Coumadin, Eliquis, Xarelto, Lovenox, Pradaxa, Brilinta, or Effient? no Aspirin ? no  Patient confirms/reports the following medications:  Current Outpatient Medications  Medication Sig Dispense Refill   amLODipine  (NORVASC ) 5 MG tablet TAKE 1 TABLET (5 MG TOTAL) BY MOUTH DAILY. 90 tablet 3   EPINEPHrine  0.3 mg/0.3 mL IJ SOAJ injection Inject 0.3 mg into the muscle as needed for anaphylaxis. 1 each 0   fluticasone  (FLONASE ) 50 MCG/ACT nasal spray Place 2 sprays into both nostrils daily as needed for allergies or rhinitis. 16 g 11   No current facility-administered medications for this visit.    Patient confirms/reports the following allergies:  Allergies  Allergen Reactions   Other Hives    GINGER ALE   Pollen Extract Itching   Alcohol     Eyelids swell    Chlorthalidone      Caused bp to go up instead of down?     No orders of the defined types were placed in this encounter.   AUTHORIZATION INFORMATION Primary Insurance: 1D#: Group #:  Secondary Insurance: 1D#: Group #:  SCHEDULE INFORMATION: Date: 10/26/23 Time: Location: [facility]

## 2023-10-25 ENCOUNTER — Telehealth: Payer: Self-pay

## 2023-10-25 ENCOUNTER — Encounter: Payer: Self-pay | Admitting: Gastroenterology

## 2023-10-25 NOTE — Telephone Encounter (Signed)
 Endo called pt to give her the time of arrival for her procedure... Pt told Carley that she never received instructions and did not know what she was supposed to do... Pt ate solid foods today so she will have to r/s... Pt stated that she will have to call back to r/s... I walked pt through to find letters on her Mychart  I called endo to have them cancel procedure

## 2023-10-26 ENCOUNTER — Ambulatory Visit: Admission: RE | Admit: 2023-10-26 | Payer: Self-pay | Source: Home / Self Care | Admitting: Gastroenterology

## 2023-10-26 ENCOUNTER — Encounter: Admission: RE | Payer: Self-pay | Source: Home / Self Care

## 2023-10-26 SURGERY — COLONOSCOPY
Anesthesia: General

## 2023-12-04 ENCOUNTER — Encounter (INDEPENDENT_AMBULATORY_CARE_PROVIDER_SITE_OTHER): Payer: Self-pay

## 2023-12-06 ENCOUNTER — Ambulatory Visit (INDEPENDENT_AMBULATORY_CARE_PROVIDER_SITE_OTHER): Admitting: Internal Medicine

## 2023-12-06 ENCOUNTER — Encounter (INDEPENDENT_AMBULATORY_CARE_PROVIDER_SITE_OTHER): Payer: Self-pay | Admitting: Internal Medicine

## 2023-12-06 VITALS — BP 131/89 | HR 73 | Temp 98.4°F | Ht 64.0 in | Wt 182.0 lb

## 2023-12-06 DIAGNOSIS — I10 Essential (primary) hypertension: Secondary | ICD-10-CM | POA: Diagnosis not present

## 2023-12-06 DIAGNOSIS — R0602 Shortness of breath: Secondary | ICD-10-CM

## 2023-12-06 DIAGNOSIS — R5383 Other fatigue: Secondary | ICD-10-CM

## 2023-12-06 DIAGNOSIS — Z6831 Body mass index (BMI) 31.0-31.9, adult: Secondary | ICD-10-CM | POA: Insufficient documentation

## 2023-12-06 DIAGNOSIS — Z72821 Inadequate sleep hygiene: Secondary | ICD-10-CM | POA: Insufficient documentation

## 2023-12-06 DIAGNOSIS — Z800 Family history of malignant neoplasm of digestive organs: Secondary | ICD-10-CM

## 2023-12-06 DIAGNOSIS — R948 Abnormal results of function studies of other organs and systems: Secondary | ICD-10-CM | POA: Insufficient documentation

## 2023-12-06 DIAGNOSIS — Z1331 Encounter for screening for depression: Secondary | ICD-10-CM

## 2023-12-06 DIAGNOSIS — E66811 Obesity, class 1: Secondary | ICD-10-CM

## 2023-12-06 NOTE — Assessment & Plan Note (Signed)
 Contributing factors: Low volume of physical activity, sedentary job, chronic skipping of meals, slow metabolic rate See obesity treatment plan above

## 2023-12-06 NOTE — Assessment & Plan Note (Signed)
 She has a sibling who had colon cancer in his 54s considering that she is at high risk ethnic group she should have her screening colonoscopy.  She will be reaching out to her primary care physician to obtain a referral.  If not completed we will proceed with referring at the next visit.

## 2023-12-06 NOTE — Progress Notes (Signed)
 [address] [place],  [place], [place] [address]  Office: [phone]  /  Fax: [phone]   Subjective   Initial Visit  Jillian Fleming (MR# [id number]) is a 43 y.o. female who presents for evaluation and treatment of obesity and related comorbidities. Current BMI is Body mass index is 31.24 kg/m. Jillian Fleming has been struggling with her weight for many years and has been unsuccessful in either losing weight, maintaining weight loss, or reaching her healthy weight goal.  Jillian Fleming is currently in the action stage of change and ready to dedicate time achieving and maintaining a healthier weight. Jillian Fleming is interested in becoming our patient and working on intensive lifestyle modifications including (but not limited to) diet and exercise for weight loss.  Weight history:  When asked how their weight has affected their life and health, she states: Has affected self-esteem, Contributed to medical problems, and Having Jillian Fleming  When asked what else they would like to accomplish? She states: Adopt a healthier eating pattern and lifestyle, Improve energy levels and physical activity, Improve existing medical conditions, and Improve quality of life  She starting to note weight gain during : pregnancy.  Life events associated with weight gain include : pregnancy.   Other contributing factors: disruption of circadian rhythm / sleep disordered breathing, reduced physical activity, chronic skipping of meals, slow metabolism for age, need for convenience due to lack of time, and sedentary job.  Their highest weight has been: 186 lbs.  Desired weight: 155-1 160 over 6 months  Previous weight-loss programs : None.  Their maximum weight loss was: Not applicable lbs.  Their greatest challenge with dieting: never tried dieting before.  Current or previous pharmacotherapy: None.  Response to medication: Never tried medications  Discussed the use of AI scribe software for clinical note transcription with  the patient, who gave verbal consent to proceed.  History of Present Illness Jillian Fleming is a 43 year old female who presents for medical weight management. She was referred by Dr. Midge for the initial visit.  She has experienced weight gain following the birth of her fourth child, who is now three years old. Previously, she was able to lose weight after her earlier pregnancies but has struggled to do so this time. Her current weight is 183 pounds.  She does not engage in regular exercise, citing Jillian Fleming and a demanding schedule as barriers. 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  She has a history of high blood pressure and antiphospholipid syndrome. No history of prediabetes, fatty liver, blood clots, high cholesterol, gestational diabetes, or eclampsia. Her pregnancies were healthy, with babies weighing around eight pounds, and she had one C-section due to placenta previa. She underwent a tubal ligation for birth control.  Her family history is significant for diabetes and high blood pressure in her mother and grandparents, and cancer in her father and siblings. Her father had prostate cancer, which was treated successfully 15-16 years ago, and her brother was recently diagnosed with colon cancer at the age of 5.  She eats out seven times a week, often consuming calorie-dense foods. She skips breakfast daily and lunch twice a week, typically only having coffee with creamer and sweeteners in the morning. She does not drink soda. She enjoys salty foods and tends to snack on chips and baked cookies, which sometimes become meals. Her husband does most of the grocery shopping,  and she finds time constraints challenging for cooking.  She sleeps about six hours per night, often disrupted by her three-year-old daughter who comes into her bed. She feels sleep-deprived, which she believes affects her  energy levels and mood. No current issues with snoring or sleep apnea, although she did snore during pregnancy.    Nutritional History:  Current nutrition plan: None.  How many times do you eat outside the home: 5-7 per week  How often do they skip meals: skips breakfast and skips lunch  What beverages do they drink: caffeinated beverages .   Use of artificial sweetners : Yes  Food intolerances or dislikes: Plan Food raw food.  Food triggers: Stress.  Food cravings: Sugary, Starches / Carbohydrates, and Salty  Do they struggle with excessive hunger or portion control : No   Physical Activity:  Current level of physical activity: Low levels of physical activity at present  Barriers to Exercise: energy and no barriers   Past medical history includes:   Past Medical History:  Diagnosis Date   High blood pressure    History of antiphospholipid syndrome    is on asa and has to have lovenox for pregnancy   History of blood transfusion 03/15/2003   after miscarriage   History of miscarriage      Objective   BP 131/89   Pulse 73   Temp 98.4 F (36.9 C)   Ht 5' 4 (1.626 m)   Wt 182 lb (82.6 kg)   LMP 11/14/2023   SpO2 99%   BMI 31.24 kg/m  She was weighed on the bioimpedance scale: Body mass index is 31.24 kg/m.    Anthropometrics:  Vitals Temp: 98.4 F (36.9 C) BP: 131/89 Pulse Rate: 73 SpO2: 99 %   Anthropometric Measurements Height: 5' 4 (1.626 m) Weight: 182 lb (82.6 kg) BMI (Calculated): 31.22 Starting Weight: 182 lb Peak Weight: 185 lb Waist Measurement : 37 inches   Body Composition  Body Fat %: 34.4 % Fat Mass (lbs): 62.8 lbs Muscle Mass (lbs): 113.6 lbs Total Body Water (lbs): 74.6 lbs Visceral Fat Rating : 7   Other Clinical Data RMR: 1382 Fasting: yes Labs: yes Today's Visit #: 1 Starting Date: 12/06/23    Physical Exam:  General: She is overweight, cooperative, alert, well developed, and in no acute  distress. PSYCH: Has normal mood, affect and thought process.   HEENT: EOMI, sclerae are anicteric. Lungs: Normal breathing effort, no conversational dyspnea. Extremities: No edema.  Neurologic: No gross sensory or motor deficits. No tremors or fasciculations noted.    Diagnostic Data Reviewed  EKG: Normal sinus rhythm, rate 70 bpm.  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  Indirect Calorimeter completed today shows a VO2 of 200 and a REE of 1382.  Her calculated basal metabolic rate is 8375 thus her resting energy expenditure slower than calculated.  Depression Screen  Jillian Fleming's PHQ-9 score was: 5.     12/06/2023    9:11 AM  Depression screen PHQ 2/9  Decreased Interest 1  Down, Depressed, Hopeless 0  PHQ - 2 Score 1  Altered sleeping 0  Tired, decreased energy 2  Change in appetite 1  Feeling bad or failure about yourself  0  Trouble concentrating 1  Moving slowly or fidgety/restless 0  Suicidal thoughts 0  PHQ-9 Score 5  Difficult doing work/chores Somewhat difficult    Screening for Sleep Related Breathing Disorders  Jillian Fleming admits to daytime somnolence and admits  to waking up still tired. Patient has a history of symptoms of morning Jillian Fleming. Jillian Fleming generally gets 6 hours of sleep per night, and states that she does not sleep well most nights. Snoring is present. Apneic episodes are not present. Epworth Sleepiness Score is 14.   BMET    Component Value Date/Time   NA 141 01/06/2023 0908   K 4.0 01/06/2023 0908   CL 105 01/06/2023 0908   CO2 26 01/06/2023 0908   GLUCOSE 93 01/06/2023 0908   BUN 12 01/06/2023 0908   CREATININE 0.93 01/06/2023 0908   CALCIUM 9.6 01/06/2023 0908   No results found for: HGBA1C No results found for: INSULIN  CBC    Component Value Date/Time   WBC 5.9 01/06/2023 0908   RBC 4.47 01/06/2023 0908   HGB 12.7 01/06/2023 0908   HCT 39.8 01/06/2023 0908   PLT 302.0 01/06/2023 0908   MCV  89.1 01/06/2023 0908   MCH 29.6 01/18/2014 1502   MCHC 31.9 01/06/2023 0908   RDW 13.8 01/06/2023 0908   Iron/TIBC/Ferritin/ %Sat No results found for: IRON, TIBC, FERRITIN, IRONPCTSAT Lipid Panel     Component Value Date/Time   CHOL 125 01/06/2023 0908   TRIG 38.0 01/06/2023 0908   HDL 44.80 01/06/2023 0908   CHOLHDL 3 01/06/2023 0908   VLDL 7.6 01/06/2023 0908   LDLCALC 73 01/06/2023 0908   Hepatic Function Panel     Component Value Date/Time   PROT 7.2 01/06/2023 0908   ALBUMIN 4.5 01/06/2023 0908   AST 13 01/06/2023 0908   ALT 8 01/06/2023 0908   ALKPHOS 49 01/06/2023 0908   BILITOT 0.5 01/06/2023 0908      Component Value Date/Time   TSH 0.85 01/06/2023 0908     Assessment and Plan   TREATMENT PLAN FOR OBESITY:  Recommended Dietary Goals  Jillian Fleming is currently in the action stage of change. As such, her goal is to implement medically supervised obesity management plan.  She has agreed to implement: the Category 1 plan - 1000 kcal per day  Behavioral Intervention  We discussed the following Behavioral Modification Strategies today: increasing lean protein intake to established goals, decreasing simple carbohydrates , increasing vegetables, increasing lower glycemic fruits, increasing fiber rich foods, avoiding skipping meals, increasing water intake, work on meal planning and preparation, work on tracking and journaling calories using tracking application, reading food labels , keeping healthy foods at home, identifying sources and decreasing liquid calories, decreasing eating out or consumption of processed foods, and making healthy choices when eating convenient foods, planning for success, and better snacking choices  Additional resources provided today: Handout on healthy eating and balanced plate, Handout on complex carbohydrates and lean sources of protein, Category 1 packet, and Handout principles of weight management  Recommended Physical Activity  Goals  Jillian Fleming has been advised to work up to 150 minutes of moderate intensity aerobic activity a week and strengthening exercises 2-3 times per week for cardiovascular health, weight loss maintenance and preservation of muscle mass.   She has agreed to :  Think about enjoyable ways to increase daily physical activity and overcoming barriers to exercise, Increase physical activity in their day and reduce sedentary time (increase NEAT)., Increase volume of physical activity to a goal of 240 minutes a week, and Combine aerobic and strengthening exercises for efficiency and improved cardiometabolic health.  Medical Interventions and Pharmacotherapy We will work on building a Therapist, art and behavioral strategies. We will discuss the role of pharmacotherapy as an adjunct  at subsequent visits.   ASSOCIATED CONDITIONS ADDRESSED TODAY  Other Jillian Fleming Jillian Fleming admits to daytime somnolence and admits to waking up still tired. Patient has a history of symptoms of morning Jillian Fleming. Jillian Fleming generally gets 6 hours of sleep per night, and states that she does not sleep well most nights. Snoring is present. Apneic episodes are not present. Epworth Sleepiness Score is 14. Jillian Fleming does feel that her weight is causing her energy to be lower than it should be. Jillian Fleming may be related to obesity, depression or many other causes. Labs will be ordered, and in the meanwhile, Desa will focus on self care including making healthy food choices, increasing physical activity and focusing on stress reduction.  Shortness of Breath Jillian Fleming notes increasing shortness of breath with physical activity and seems to be worsening over time with weight gain. She notes getting out of breath sooner with activity than she used to. This has not gotten worse recently. Jillian Fleming denies shortness of breath at rest or orthopnea.  Assessment & Plan Other Jillian Fleming  Depression screen  SOB (shortness of breath) on  exertion  Essential hypertension Blood pressure slightly above target of less than 130/80.  She is currently on amlodipine  5 mg once a day.  Losing 10% of body weight may improve blood pressure control also maintaining a diet low in sodium.  We will be checking renal parameters and electrolytes today.  We will assess cardiovascular risk at her next office visit with additional recommendations. Class 1 obesity with body mass index (BMI) of 31.0 to 31.9 in adult, unspecified obesity type, unspecified whether serious comorbidity present Contributing factors: Low volume of physical activity, sedentary job, chronic skipping of meals, slow metabolic rate See obesity treatment plan above Family history of colon cancer She has a sibling who had colon cancer in his 7s considering that she is at high risk ethnic group she should have her screening colonoscopy.  She will be reaching out to her primary care physician to obtain a referral.  If not completed we will proceed with referring at the next visit. Abnormal metabolism Patient has a slower than predicted metabolism. IC 1382 vs. calculated 1624. This may contribute to weight gain, chronic Jillian Fleming and difficulty losing weight.   We reviewed measures to improve metabolism including not skipping meals, progressive strengthening exercises, increasing protein intake at every meal and maintaining adequate hydration and sleep.   Inadequate sleep hygiene She has an elevated Epworth of 14 but no signs or symptoms of sleep disordered breathing her sleep is interrupted due to her 76-year-old child going into her bed in the middle of the night which results in slight sleep.  We discussed the importance of getting at least 7 hours of sleep and the effects of sleep deprivation particularly around weight.    Follow-up  She was informed of the importance of frequent follow-up visits to maximize her success with intensive lifestyle modifications for her multiple health  conditions. She was informed we would discuss her lab results at her next visit unless there is a critical issue that needs to be addressed sooner. Jillian Fleming agreed to keep her next visit at the agreed upon time to discuss these results.  Attestation Statement  This is the patient's intake visit at [facility]. The patient's Health Questionnaire was reviewed at length. Included in the packet: current and past health history, medications, allergies, ROS, gynecologic history (women only), surgical history, family history, social history, weight history, weight loss surgery history (for those that have  had weight loss surgery), nutritional evaluation, mood and food questionnaire, PHQ9, Epworth questionnaire, sleep habits questionnaire, patient life and health improvement goals questionnaire. These will all be scanned into the patient's chart under media.   During the visit, I independently reviewed the patient's EKG, previous labs, bioimpedance scale results, and indirect calorimetry results. I used this information to medically tailor a meal plan for the patient that will help her to lose weight and will improve her obesity-related conditions. I performed a medically necessary appropriate examination and/or evaluation. I discussed the assessment and treatment plan with the patient. The patient was provided an opportunity to ask questions and all were answered. The patient agreed with the plan and demonstrated an understanding of the instructions. Labs were ordered at this visit and will be reviewed at the next visit unless critical results need to be addressed immediately. Clinical information was updated and documented in the EMR.   In addition, they received basic education on identification of processed foods and reduction of these, different sources of lean proteins and complex carbohydrates and how to eat balanced by incorporation of whole foods.  Reviewed by clinician on day of visit:  allergies, medications, problem list, medical history, surgical history, family history, social history, and previous encounter notes.  I have spent 58 minutes in the care of the patient today including: 3 minutes before the visit reviewing and preparing the chart. 43 minutes face-to-face assessing and reviewing listed medical problems as outlined in obesity care plan, providing nutritional and behavioral counseling on topics outlined in the obesity care plan, independently interpreting test results and goals of care, as described in assessment and plan, reviewing and discussing biometric information and progress, and ordering diagnostics - see orders 10 minutes after the visit updating chart and documentation of encounter.       Lucas Parker, MD

## 2023-12-06 NOTE — Assessment & Plan Note (Signed)
 Patient has a slower than predicted metabolism. IC 1382 vs. calculated 1624. This may contribute to weight gain, chronic fatigue and difficulty losing weight.   We reviewed measures to improve metabolism including not skipping meals, progressive strengthening exercises, increasing protein intake at every meal and maintaining adequate hydration and sleep.

## 2023-12-06 NOTE — Assessment & Plan Note (Signed)
 Blood pressure slightly above target of less than 130/80.  She is currently on amlodipine  5 mg once a day.  Losing 10% of body weight may improve blood pressure control also maintaining a diet low in sodium.  We will be checking renal parameters and electrolytes today.  We will assess cardiovascular risk at her next office visit with additional recommendations.

## 2023-12-06 NOTE — Assessment & Plan Note (Signed)
 She has an elevated Epworth of 14 but no signs or symptoms of sleep disordered breathing her sleep is interrupted due to her 44-year-old child going into her bed in the middle of the night which results in slight sleep.  We discussed the importance of getting at least 7 hours of sleep and the effects of sleep deprivation particularly around weight.

## 2023-12-07 LAB — CBC WITH DIFFERENTIAL/PLATELET
Basophils Absolute: 0 x10E3/uL (ref 0.0–0.2)
Basos: 1 %
EOS (ABSOLUTE): 0.3 x10E3/uL (ref 0.0–0.4)
Eos: 5 %
Hematocrit: 41 % (ref 34.0–46.6)
Hemoglobin: 13 g/dL (ref 11.1–15.9)
Immature Grans (Abs): 0 x10E3/uL (ref 0.0–0.1)
Immature Granulocytes: 0 %
Lymphocytes Absolute: 1.4 x10E3/uL (ref 0.7–3.1)
Lymphs: 26 %
MCH: 28 pg (ref 26.6–33.0)
MCHC: 31.7 g/dL (ref 31.5–35.7)
MCV: 88 fL (ref 79–97)
Monocytes Absolute: 0.4 x10E3/uL (ref 0.1–0.9)
Monocytes: 7 %
Neutrophils Absolute: 3.2 x10E3/uL (ref 1.4–7.0)
Neutrophils: 61 %
Platelets: 284 x10E3/uL (ref 150–450)
RBC: 4.64 x10E6/uL (ref 3.77–5.28)
RDW: 12.9 % (ref 11.7–15.4)
WBC: 5.3 x10E3/uL (ref 3.4–10.8)

## 2023-12-07 LAB — COMPREHENSIVE METABOLIC PANEL WITH GFR
ALT: 9 IU/L (ref 0–32)
AST: 15 IU/L (ref 0–40)
Albumin: 4.8 g/dL (ref 3.9–4.9)
Alkaline Phosphatase: 63 IU/L (ref 41–116)
BUN/Creatinine Ratio: 11 (ref 9–23)
BUN: 11 mg/dL (ref 6–24)
Bilirubin Total: 0.5 mg/dL (ref 0.0–1.2)
CO2: 21 mmol/L (ref 20–29)
Calcium: 9.6 mg/dL (ref 8.7–10.2)
Chloride: 101 mmol/L (ref 96–106)
Creatinine, Ser: 0.96 mg/dL (ref 0.57–1.00)
Globulin, Total: 2.7 g/dL (ref 1.5–4.5)
Glucose: 79 mg/dL (ref 70–99)
Potassium: 3.9 mmol/L (ref 3.5–5.2)
Sodium: 138 mmol/L (ref 134–144)
Total Protein: 7.5 g/dL (ref 6.0–8.5)
eGFR: 75 mL/min/1.73 (ref >59)

## 2023-12-07 LAB — LIPID PANEL WITH LDL/HDL RATIO
Cholesterol, Total: 133 mg/dL (ref 100–199)
HDL: 42 mg/dL (ref >39)
LDL Chol Calc (NIH): 81 mg/dL (ref 0–99)
LDL/HDL Ratio: 1.9 ratio (ref 0.0–3.2)
Triglycerides: 43 mg/dL (ref 0–149)
VLDL Cholesterol Cal: 10 mg/dL (ref 5–40)

## 2023-12-07 LAB — VITAMIN B12: Vitamin B-12: 1473 pg/mL — ABNORMAL HIGH (ref 232–1245)

## 2023-12-07 LAB — TSH: TSH: 1.02 u[IU]/mL (ref 0.450–4.500)

## 2023-12-07 LAB — HEMOGLOBIN A1C
Est. average glucose Bld gHb Est-mCnc: 111 mg/dL
Hgb A1c MFr Bld: 5.5 % (ref 4.8–5.6)

## 2023-12-07 LAB — VITAMIN D 25 HYDROXY (VIT D DEFICIENCY, FRACTURES): Vit D, 25-Hydroxy: 27.2 ng/mL — ABNORMAL LOW (ref 30.0–100.0)

## 2023-12-07 LAB — INSULIN, RANDOM: INSULIN: 6.9 u[IU]/mL (ref 2.6–24.9)

## 2023-12-20 ENCOUNTER — Encounter (INDEPENDENT_AMBULATORY_CARE_PROVIDER_SITE_OTHER): Payer: Self-pay | Admitting: Internal Medicine

## 2023-12-20 ENCOUNTER — Ambulatory Visit (INDEPENDENT_AMBULATORY_CARE_PROVIDER_SITE_OTHER): Admitting: Internal Medicine

## 2023-12-20 VITALS — BP 133/88 | HR 67 | Temp 98.0°F | Ht 64.0 in | Wt 182.0 lb

## 2023-12-20 DIAGNOSIS — I10 Essential (primary) hypertension: Secondary | ICD-10-CM | POA: Diagnosis not present

## 2023-12-20 DIAGNOSIS — E559 Vitamin D deficiency, unspecified: Secondary | ICD-10-CM

## 2023-12-20 DIAGNOSIS — Z6831 Body mass index (BMI) 31.0-31.9, adult: Secondary | ICD-10-CM

## 2023-12-20 DIAGNOSIS — R948 Abnormal results of function studies of other organs and systems: Secondary | ICD-10-CM

## 2023-12-20 DIAGNOSIS — E66811 Obesity, class 1: Secondary | ICD-10-CM | POA: Diagnosis not present

## 2023-12-20 MED ORDER — METFORMIN HCL ER 500 MG PO TB24: 500.0000 mg | ORAL_TABLET | Freq: Every day | ORAL | 0 refills | Status: DC

## 2023-12-20 MED ORDER — VITAMIN D (ERGOCALCIFEROL) 1.25 MG (50000 UNIT) PO CAPS: 50000.0000 [IU] | ORAL_CAPSULE | ORAL | 0 refills | Status: DC

## 2023-12-20 NOTE — Assessment & Plan Note (Signed)
 Patient has a slower than predicted metabolism. IC 1382 vs. calculated 1624. This may contribute to weight gain, chronic fatigue and difficulty losing weight.   We reviewed measures to improve metabolism including not skipping meals, progressive strengthening exercises, increasing protein intake at every meal and maintaining adequate hydration and sleep.

## 2023-12-20 NOTE — Assessment & Plan Note (Signed)
 Essential hypertension with a current reading of 133/88 mmHg, slightly above the target of less than 130/80 mmHg. Discussed genetic component and management through weight loss and lifestyle changes. - Monitor blood pressure regularly. - Aim for blood pressure less than 130/80 mmHg. - Continue current hypertension management plan.

## 2023-12-20 NOTE — Progress Notes (Signed)
 Office: [phone]  /  Fax: [phone]  Weight Summary and Body Composition Analysis (BIA)  Vitals Temp: 98 F (36.7 C) BP: 133/88 Pulse Rate: 67 SpO2: 100 %   Anthropometric Measurements Height: 5' 4 (1.626 m) Weight: 182 lb (82.6 kg) BMI (Calculated): 31.22 Weight at Last Visit: 182 lb Weight Lost Since Last Visit: 0 lb Weight Gained Since Last Visit: 0 lb Starting Weight: 182 lb Total Weight Loss (lbs): 0 lb (0 kg) Peak Weight: 185 lb   Body Composition  Body Fat %: 35.8 % Fat Mass (lbs): 65.4 lbs Muscle Mass (lbs): 111.4 lbs Total Body Water (lbs): 76.6 lbs   The 10-year ASCVD risk score (Arnett DK, et al., 2019) is: 3%  RMR: 1382  Today's Visit #: 2  Starting Date: 12/06/23   Subjective   Chief Complaint: Obesity  Interval History Discussed the use of AI scribe software for clinical note transcription with the patient, who gave verbal consent to proceed.  History of Present Illness Jillian Fleming is a 43 year old female with high blood pressure and antiphospholipid syndrome who presents for obesity and weight management.  She has been struggling to lose weight despite maintaining a calorie intake of 1000 calories per day, tracked using the My Fitness Pal app. Her exercise routine includes 45 minutes of cardio and strengthening exercises twice a week. Despite these efforts, she has not observed significant weight loss.  Her metabolic test indicated her metabolism is slower than it should be. She generally keeps her calorie intake close to 1000 calories, occasionally exceeding it slightly. She has noted an increase in energy levels recently.  She has a history of high blood pressure and antiphospholipid syndrome. There is no history of glucose problems, and recent lab results show normal kidney function, liver enzymes, and cholesterol levels. However, her vitamin D  levels are low.  Her family history includes colorectal cancer, as her father was  diagnosed at around 27 years old. Due to this family history, she has a scheduled colonoscopy. She also undergoes regular mammograms due to dense breast tissue.  She has explored various dietary approaches, including intermittent fasting, but found it challenging to maintain due to her work schedule. She is considering incorporating meal replacements to manage her calorie intake more effectively.     Challenges affecting patient progress: inadequate response to nutritional and behavioral strategies.    Pharmacotherapy for weight management: She is currently taking no anti-obesity medication.   Assessment and Plan   Treatment Plan For Obesity:  Recommended Dietary Goals  Cypress is currently in the action stage of change. As such, her goal is to continue weight management plan. She has agreed to: incorporate 1-2 meal replacements a day for convenience  and continue current plan  Behavioral Health and Counseling  We discussed the following behavioral modification strategies today: continue to work on maintaining a reduced calorie state, getting the recommended amount of protein, incorporating whole foods, making healthy choices, staying well hydrated and practicing mindfulness when eating. and increase protein intake, fibrous foods (25 grams per day for women, 30 grams for men) and water to improve satiety and decrease hunger signals. .  Additional education and resources provided today: Handout on increasing daily activity and exercise goal setting  Recommended Physical Activity Goals  Gerald has been advised to work up to 150 minutes of moderate intensity aerobic activity a week and strengthening exercises 2-3 times per week for cardiovascular health, weight loss maintenance and preservation of muscle mass.  She  has agreed to :  Increase volume of physical activity to a goal of 240 minutes a week and Combine aerobic and strengthening exercises for efficiency and improved  cardiometabolic health.  Medical Interventions and Pharmacotherapy  We discussed various medication options to help Carianna with her weight loss efforts and we both agreed to : Start metformin XR 500 mg once a day off label for weight management reviewed medication side effects.  Associated Conditions Impacted by Obesity Treatment  Assessment & Plan Abnormal metabolism Patient has a slower than predicted metabolism. IC 1382 vs. calculated 1624. This may contribute to weight gain, chronic fatigue and difficulty losing weight.   We reviewed measures to improve metabolism including not skipping meals, progressive strengthening exercises, increasing protein intake at every meal and maintaining adequate hydration and sleep.   Class 1 obesity with body mass index (BMI) of 31.0 to 31.9 in adult, unspecified obesity type, unspecified whether serious comorbidity present Obesity with a slower metabolic rate of 8617 kcal/day. Despite a 1000 calorie diet and exercising twice a week, weight loss has not been achieved. Discussed accurate calorie tracking and the impact of metabolic rate on weight loss. Emphasized increased physical activity and proper nutrition. Discussed metformin for appetite suppression, benefits include changes in gut bacteria, LDL cholesterol improvement, and reduction of visceral fat. Common side effects include loose stools due to decreased sugar absorption. - Continue 1000 calorie diet and track using My Fitness Pal. - Increase physical activity to 240 minutes per week, including 150 minutes of cardio and 90 minutes of strengthening exercises. - Consider using a scale to weigh food for accurate calorie tracking. - Start metformin for appetite suppression, 90-day supply, take with breakfast. - Do not integrate exercise calories into daily calorie count initially. - Consider intermittent fasting with a fixed eating window. - Use a meal replacement for one meal a day if  needed. -Reviewed labs she has no lipid or glucose abnormalities. Vitamin D  deficiency Most recent vitamin D  levels  Lab Results  Component Value Date   VD25OH 27.2 (L) 12/06/2023     Deficiency state associated with adiposity and may result in leptin resistance, weight gain and fatigue.  Plan: After discussion of benefits, alternative treatment options and side effects patient will be started on vitamin D2 50,000 units 1 tablet weekly for 3-4 months. for a treatment goal level of 50-60 mg/dl. Check levels at that time for response monitoring.  Essential hypertension Essential hypertension with a current reading of 133/88 mmHg, slightly above the target of less than 130/80 mmHg. Discussed genetic component and management through weight loss and lifestyle changes. - Monitor blood pressure regularly. - Aim for blood pressure less than 130/80 mmHg. - Continue current hypertension management plan.         Objective   Physical Exam:  Blood pressure 133/88, pulse 67, temperature 98 F (36.7 C), height 5' 4 (1.626 m), weight 182 lb (82.6 kg), last menstrual period 11/14/2023, SpO2 100%. Body mass index is 31.24 kg/m.  General: She is overweight, cooperative, alert, well developed, and in no acute distress. PSYCH: Has normal mood, affect and thought process.   HEENT: EOMI, sclerae are anicteric. Lungs: Normal breathing effort, no conversational dyspnea. Extremities: No edema.  Neurologic: No gross sensory or motor deficits. No tremors or fasciculations noted.    Diagnostic Data Reviewed:  BMET    Component Value Date/Time   NA 138 12/06/2023 1034   K 3.9 12/06/2023 1034   CL 101 12/06/2023 1034   CO2  21 12/06/2023 1034   GLUCOSE 79 12/06/2023 1034   GLUCOSE 93 01/06/2023 0908   BUN 11 12/06/2023 1034   CREATININE 0.96 12/06/2023 1034   CALCIUM 9.6 12/06/2023 1034   Lab Results  Component Value Date   HGBA1C 5.5 12/06/2023   Lab Results  Component Value Date    INSULIN  6.9 12/06/2023   Lab Results  Component Value Date   TSH 1.020 12/06/2023   CBC    Component Value Date/Time   WBC 5.3 12/06/2023 1034   WBC 5.9 01/06/2023 0908   RBC 4.64 12/06/2023 1034   RBC 4.47 01/06/2023 0908   HGB 13.0 12/06/2023 1034   HCT 41.0 12/06/2023 1034   PLT 284 12/06/2023 1034   MCV 88 12/06/2023 1034   MCH 28.0 12/06/2023 1034   MCH 29.6 01/18/2014 1502   MCHC 31.7 12/06/2023 1034   MCHC 31.9 01/06/2023 0908   RDW 12.9 12/06/2023 1034   Iron Studies No results found for: IRON, TIBC, FERRITIN, IRONPCTSAT Lipid Panel     Component Value Date/Time   CHOL 133 12/06/2023 1034   TRIG 43 12/06/2023 1034   HDL 42 12/06/2023 1034   CHOLHDL 3 01/06/2023 0908   VLDL 7.6 01/06/2023 0908   LDLCALC 81 12/06/2023 1034   Hepatic Function Panel     Component Value Date/Time   PROT 7.5 12/06/2023 1034   ALBUMIN 4.8 12/06/2023 1034   AST 15 12/06/2023 1034   ALT 9 12/06/2023 1034   ALKPHOS 63 12/06/2023 1034   BILITOT 0.5 12/06/2023 1034      Component Value Date/Time   TSH 1.020 12/06/2023 1034   Nutritional Lab Results  Component Value Date   VD25OH 27.2 (L) 12/06/2023    Medications: Outpatient Encounter Medications as of 12/20/2023  Medication Sig   fluticasone  (FLONASE ) 50 MCG/ACT nasal spray Place 2 sprays into both nostrils daily as needed for allergies or rhinitis.   ibuprofen  (ADVIL ) 200 MG tablet Take 200 mg by mouth every 6 (six) hours as needed.   metFORMIN (GLUCOPHAGE-XR) 500 MG 24 hr tablet Take 1 tablet (500 mg total) by mouth daily with breakfast.   Vitamin D , Ergocalciferol , (DRISDOL) 1.25 MG (50000 UNIT) CAPS capsule Take 1 capsule (50,000 Units total) by mouth every 7 (seven) days.   amLODipine  (NORVASC ) 5 MG tablet TAKE 1 TABLET (5 MG TOTAL) BY MOUTH DAILY.   EPINEPHrine  0.3 mg/0.3 mL IJ SOAJ injection Inject 0.3 mg into the muscle as needed for anaphylaxis. (Patient not taking: Reported on 12/20/2023)   No  facility-administered encounter medications on file as of 12/20/2023.     Follow-Up   Return in about 3 weeks (around 01/10/2024) for For Weight Mangement with Dr. Francyne.SABRA She was informed of the importance of frequent follow up visits to maximize her success with intensive lifestyle modifications for her multiple health conditions.  Attestation Statement   Reviewed by clinician on day of visit: allergies, medications, problem list, medical history, surgical history, family history, social history, and previous encounter notes.   I have spent 42 minutes in the care of the patient today including: 3 minutes before the visit reviewing and preparing the chart. 32 minutes face-to-face assessing and reviewing listed medical problems as outlined in obesity care plan, providing nutritional and behavioral counseling on topics outlined in the obesity care plan, counseling regarding anti-obesity medication as outlined in obesity care plan, independently interpreting test results and goals of care, as described in assessment and plan, reviewing and discussing biometric information and progress,  and ordering medications - see orders 7 minutes after the visit updating chart and documentation of encounter.    Lucas Parker, MD

## 2023-12-20 NOTE — Assessment & Plan Note (Signed)
 Most recent vitamin D  levels  Lab Results  Component Value Date   VD25OH 27.2 (L) 12/06/2023     Deficiency state associated with adiposity and may result in leptin resistance, weight gain and fatigue.  Plan: After discussion of benefits, alternative treatment options and side effects patient will be started on vitamin D2 50,000 units 1 tablet weekly for 3-4 months. for a treatment goal level of 50-60 mg/dl. Check levels at that time for response monitoring.

## 2023-12-20 NOTE — Assessment & Plan Note (Signed)
 Obesity with a slower metabolic rate of 8617 kcal/day. Despite a 1000 calorie diet and exercising twice a week, weight loss has not been achieved. Discussed accurate calorie tracking and the impact of metabolic rate on weight loss. Emphasized increased physical activity and proper nutrition. Discussed metformin for appetite suppression, benefits include changes in gut bacteria, LDL cholesterol improvement, and reduction of visceral fat. Common side effects include loose stools due to decreased sugar absorption. - Continue 1000 calorie diet and track using My Fitness Pal. - Increase physical activity to 240 minutes per week, including 150 minutes of cardio and 90 minutes of strengthening exercises. - Consider using a scale to weigh food for accurate calorie tracking. - Start metformin for appetite suppression, 90-day supply, take with breakfast. - Do not integrate exercise calories into daily calorie count initially. - Consider intermittent fasting with a fixed eating window. - Use a meal replacement for one meal a day if needed. -Reviewed labs Jillian Fleming.

## 2024-01-03 ENCOUNTER — Encounter: Payer: Self-pay | Admitting: Family Medicine

## 2024-01-07 ENCOUNTER — Telehealth: Payer: Self-pay | Admitting: Family Medicine

## 2024-01-07 DIAGNOSIS — E559 Vitamin D deficiency, unspecified: Secondary | ICD-10-CM

## 2024-01-07 DIAGNOSIS — Z0000 Encounter for general adult medical examination without abnormal findings: Secondary | ICD-10-CM

## 2024-01-07 DIAGNOSIS — I10 Essential (primary) hypertension: Secondary | ICD-10-CM

## 2024-01-07 NOTE — Telephone Encounter (Signed)
-----   Message from Veva JINNY Ferrari sent at 12/26/2023  2:54 PM EDT ----- Regarding: Lab orders for Mon, 10.27.25 Patient is scheduled for CPX labs, please order future labs, Thanks , Veva

## 2024-01-08 ENCOUNTER — Other Ambulatory Visit: Payer: BC Managed Care – PPO

## 2024-01-10 ENCOUNTER — Encounter (INDEPENDENT_AMBULATORY_CARE_PROVIDER_SITE_OTHER): Payer: Self-pay | Admitting: Internal Medicine

## 2024-01-10 ENCOUNTER — Ambulatory Visit (INDEPENDENT_AMBULATORY_CARE_PROVIDER_SITE_OTHER): Admitting: Internal Medicine

## 2024-01-10 VITALS — BP 130/86 | HR 84 | Temp 98.5°F | Ht 64.0 in | Wt 180.0 lb

## 2024-01-10 DIAGNOSIS — I10 Essential (primary) hypertension: Secondary | ICD-10-CM | POA: Diagnosis not present

## 2024-01-10 DIAGNOSIS — Z6831 Body mass index (BMI) 31.0-31.9, adult: Secondary | ICD-10-CM

## 2024-01-10 DIAGNOSIS — E66811 Obesity, class 1: Secondary | ICD-10-CM | POA: Diagnosis not present

## 2024-01-10 DIAGNOSIS — R948 Abnormal results of function studies of other organs and systems: Secondary | ICD-10-CM | POA: Diagnosis not present

## 2024-01-10 NOTE — Progress Notes (Signed)
 Office: [phone]  /  Fax: [phone]  Weight Summary and Body Composition Analysis (BIA)  Vitals Temp: 98.5 F (36.9 C) BP: 130/86 Pulse Rate: 84 SpO2: 100 %   Anthropometric Measurements Height: 5' 4 (1.626 m) Weight: 180 lb (81.6 kg) BMI (Calculated): 30.88 Weight at Last Visit: 182 lb Weight Lost Since Last Visit: 2 lb Weight Gained Since Last Visit: 0 lb Starting Weight: 182 lb Total Weight Loss (lbs): 2 lb (0.907 kg) Peak Weight: 185 lb   Body Composition  Body Fat %: 34.8 % Fat Mass (lbs): 62.8 lbs Muscle Mass (lbs): 111.4 lbs Total Body Water (lbs): 77 lbs    RMR: 1382  Today's Visit #: 3  Starting Date: 12/06/23   Subjective   Chief Complaint: Obesity  Interval History Discussed the use of AI scribe software for clinical note transcription with the patient, who gave verbal consent to proceed.  History of Present Illness Jillian Fleming is a 43 year old female with hypertension who presents for medical weight management.  She has lost two pounds since her last visit and adheres to a 1000 calorie nutrition plan. She tracks her food intake, ensures adequate protein consumption, and exercises four to five days a week, engaging in 30 minutes of cardio and strength training.  She started on metformin during her last visit, which has not caused significant gastrointestinal side effects, only 'loose' stools, and has helped with regularity. She has not noticed changes in appetite but feels a sense of fullness more quickly, allowing her to extend meals longer without feeling hungry.  She is currently taking amlodipine  for hypertension. She also takes vitamin D , but there was an issue with the pharmacy not providing the full four-month supply as prescribed.  She is planning to go on a cruise soon. She does not drink alcohol, which helps in managing calorie intake during the cruise.     Challenges affecting patient progress: none.     Pharmacotherapy for weight management: She is currently taking Metformin (off label use for weight management and / or insulin  resistance and / or diabetes prevention) with adequate clinical response  and without side effects..   Assessment and Plan   Treatment Plan For Obesity:  Recommended Dietary Goals  Aqua is currently in the action stage of change. As such, her goal is to continue weight management plan. She has agreed to: continue current plan  Behavioral Health and Counseling  We discussed the following behavioral modification strategies today: continue to work on maintaining a reduced calorie state, getting the recommended amount of protein, incorporating whole foods, making healthy choices, staying well hydrated and practicing mindfulness when eating. and increase protein intake, fibrous foods (25 grams per day for women, 30 grams for men) and water to improve satiety and decrease hunger signals. .  Additional education and resources provided today: Handout on traveling and holiday eating strategies  Recommended Physical Activity Goals  [name] has been advised to work up to 150 minutes of moderate intensity aerobic activity a week and strengthening exercises 2-3 times per week for cardiovascular health, weight loss maintenance and preservation of muscle mass.  She has agreed to :  Increase volume of physical activity to a goal of 240 minutes a week and Combine aerobic and strengthening exercises for efficiency and improved cardiometabolic health.  Medical Interventions and Pharmacotherapy  We discussed various medication options to help [name] with her weight loss efforts and we both agreed to : Adequate clinical response to anti-obesity medication, continue  current regimen  Associated Conditions Impacted by Obesity Treatment  Assessment & Plan Abnormal metabolism Patient has a slower than predicted metabolism. IC 1382 vs. calculated 1624. This may contribute to  weight gain, chronic fatigue and difficulty losing weight.   We reviewed measures to improve metabolism including not skipping meals, progressive strengthening exercises, increasing protein intake at every meal and maintaining adequate hydration and sleep.   Class 1 obesity with body mass index (BMI) of 31.0 to 31.9 in adult, unspecified obesity type, unspecified whether serious comorbidity present  Management with a focus on weight loss and body composition improvement. Current body fat percentage is 34%, which is within the upper range of normal for her age and height. Weight loss is gradual due to a slower metabolic rate, influenced by genetic factors. Current weight loss of 2 pounds since the last visit is considered a good rate given her metabolic rate. Metformin is being used to aid in weight management, with noted improvements in bowel regularity and a sense of fullness. No significant changes in appetite reported. Emphasis on maintaining a balanced diet with adequate protein intake and regular exercise to support weight loss and metabolic health. - Continue 1000 calorie nutrition plan. - Continue metformin at current dose. - Encouraged protein intake of 25-30 grams per meal. - Encouraged regular exercise, including cardio and strengthening. - Provided information on managing diet during holidays and travel. - Scheduled follow-up in 4 weeks. Essential hypertension Essential hypertension with a current reading of 133/86 mmHg, slightly above the target of less than 130/80 mmHg.  Currently on amlodipine  without any adverse effects -Continue amlodipine  -Continue with nutrition and behavioral strategies for weight management        Objective   Physical Exam:  Blood pressure 130/86, pulse 84, temperature 98.5 F (36.9 C), height 5' 4 (1.626 m), weight 180 lb (81.6 kg), last menstrual period 01/01/2024, SpO2 100%. Body mass index is 30.9 kg/m.  General: She is overweight, cooperative,  alert, well developed, and in no acute distress. PSYCH: Has normal mood, affect and thought process.   HEENT: EOMI, sclerae are anicteric. Lungs: Normal breathing effort, no conversational dyspnea. Extremities: No edema.  Neurologic: No gross sensory or motor deficits. No tremors or fasciculations noted.    Diagnostic Data Reviewed:  BMET    Component Value Date/Time   NA 138 12/06/2023 1034   K 3.9 12/06/2023 1034   CL 101 12/06/2023 1034   CO2 21 12/06/2023 1034   GLUCOSE 79 12/06/2023 1034   GLUCOSE 93 01/06/2023 0908   BUN 11 12/06/2023 1034   CREATININE 0.96 12/06/2023 1034   CALCIUM 9.6 12/06/2023 1034   Lab Results  Component Value Date   HGBA1C 5.5 12/06/2023   Lab Results  Component Value Date   INSULIN  6.9 12/06/2023   Lab Results  Component Value Date   TSH 1.020 12/06/2023   CBC    Component Value Date/Time   WBC 5.3 12/06/2023 1034   WBC 5.9 01/06/2023 0908   RBC 4.64 12/06/2023 1034   RBC 4.47 01/06/2023 0908   HGB 13.0 12/06/2023 1034   HCT 41.0 12/06/2023 1034   PLT 284 12/06/2023 1034   MCV 88 12/06/2023 1034   MCH 28.0 12/06/2023 1034   MCH 29.6 01/18/2014 1502   MCHC 31.7 12/06/2023 1034   MCHC 31.9 01/06/2023 0908   RDW 12.9 12/06/2023 1034   Iron Studies No results found for: IRON, TIBC, FERRITIN, IRONPCTSAT Lipid Panel     Component Value Date/Time  CHOL 133 12/06/2023 1034   TRIG 43 12/06/2023 1034   HDL 42 12/06/2023 1034   CHOLHDL 3 01/06/2023 0908   VLDL 7.6 01/06/2023 0908   LDLCALC 81 12/06/2023 1034   Hepatic Function Panel     Component Value Date/Time   PROT 7.5 12/06/2023 1034   ALBUMIN 4.8 12/06/2023 1034   AST 15 12/06/2023 1034   ALT 9 12/06/2023 1034   ALKPHOS 63 12/06/2023 1034   BILITOT 0.5 12/06/2023 1034      Component Value Date/Time   TSH 1.020 12/06/2023 1034   Nutritional Lab Results  Component Value Date   VD25OH 27.2 (L) 12/06/2023    Medications: Outpatient Encounter Medications  as of 01/10/2024  Medication Sig   amLODipine  (NORVASC ) 5 MG tablet TAKE 1 TABLET (5 MG TOTAL) BY MOUTH DAILY.   fluticasone  (FLONASE ) 50 MCG/ACT nasal spray Place 2 sprays into both nostrils daily as needed for allergies or rhinitis.   ibuprofen  (ADVIL ) 200 MG tablet Take 200 mg by mouth every 6 (six) hours as needed.   metFORMIN (GLUCOPHAGE-XR) 500 MG 24 hr tablet Take 1 tablet (500 mg total) by mouth daily with breakfast.   Vitamin D , Ergocalciferol , (DRISDOL) 1.25 MG (50000 UNIT) CAPS capsule Take 1 capsule (50,000 Units total) by mouth every 7 (seven) days.   EPINEPHrine  0.3 mg/0.3 mL IJ SOAJ injection Inject 0.3 mg into the muscle as needed for anaphylaxis. (Patient not taking: Reported on 01/10/2024)   No facility-administered encounter medications on file as of 01/10/2024.     Follow-Up   Return in about 4 weeks (around 02/07/2024) for For Weight Mangement with Dr. Francyne, May schedule a week before or after due to holiday.SABRA She was informed of the importance of frequent follow up visits to maximize her success with intensive lifestyle modifications for her multiple health conditions.  Attestation Statement   Reviewed by clinician on day of visit: allergies, medications, problem list, medical history, surgical history, family history, social history, and previous encounter notes.     Lucas Francyne, MD

## 2024-01-10 NOTE — Assessment & Plan Note (Signed)
 Essential hypertension with a current reading of 133/86 mmHg, slightly above the target of less than 130/80 mmHg.  Currently on amlodipine  without any adverse effects -Continue amlodipine  -Continue with nutrition and behavioral strategies for weight management

## 2024-01-10 NOTE — Assessment & Plan Note (Signed)
 Patient has a slower than predicted metabolism. IC 1382 vs. calculated 1624. This may contribute to weight gain, chronic fatigue and difficulty losing weight.   We reviewed measures to improve metabolism including not skipping meals, progressive strengthening exercises, increasing protein intake at every meal and maintaining adequate hydration and sleep.

## 2024-01-10 NOTE — Assessment & Plan Note (Signed)
  Management with a focus on weight loss and body composition improvement. Current body fat percentage is 34%, which is within the upper range of normal for her age and height. Weight loss is gradual due to a slower metabolic rate, influenced by genetic factors. Current weight loss of 2 pounds since the last visit is considered a good rate given her metabolic rate. Metformin is being used to aid in weight management, with noted improvements in bowel regularity and a sense of fullness. No significant changes in appetite reported. Emphasis on maintaining a balanced diet with adequate protein intake and regular exercise to support weight loss and metabolic health. - Continue 1000 calorie nutrition plan. - Continue metformin at current dose. - Encouraged protein intake of 25-30 grams per meal. - Encouraged regular exercise, including cardio and strengthening. - Provided information on managing diet during holidays and travel. - Scheduled follow-up in 4 weeks.

## 2024-01-15 ENCOUNTER — Ambulatory Visit: Payer: BC Managed Care – PPO | Admitting: Family Medicine

## 2024-01-15 ENCOUNTER — Encounter: Payer: Self-pay | Admitting: Family Medicine

## 2024-01-15 VITALS — BP 124/86 | HR 72 | Temp 98.3°F | Ht 64.0 in | Wt 182.4 lb

## 2024-01-15 DIAGNOSIS — E559 Vitamin D deficiency, unspecified: Secondary | ICD-10-CM | POA: Diagnosis not present

## 2024-01-15 DIAGNOSIS — Z23 Encounter for immunization: Secondary | ICD-10-CM | POA: Diagnosis not present

## 2024-01-15 DIAGNOSIS — E669 Obesity, unspecified: Secondary | ICD-10-CM

## 2024-01-15 DIAGNOSIS — I10 Essential (primary) hypertension: Secondary | ICD-10-CM

## 2024-01-15 DIAGNOSIS — Z800 Family history of malignant neoplasm of digestive organs: Secondary | ICD-10-CM

## 2024-01-15 DIAGNOSIS — Z0000 Encounter for general adult medical examination without abnormal findings: Secondary | ICD-10-CM

## 2024-01-15 DIAGNOSIS — T753XXS Motion sickness, sequela: Secondary | ICD-10-CM

## 2024-01-15 DIAGNOSIS — N6019 Diffuse cystic mastopathy of unspecified breast: Secondary | ICD-10-CM | POA: Insufficient documentation

## 2024-01-15 DIAGNOSIS — T753XXA Motion sickness, initial encounter: Secondary | ICD-10-CM | POA: Insufficient documentation

## 2024-01-15 MED ORDER — SCOPOLAMINE 1 MG/3DAYS TD PT72: 1.0000 | MEDICATED_PATCH | TRANSDERMAL | 0 refills | Status: AC

## 2024-01-15 MED ORDER — FLUTICASONE PROPIONATE 50 MCG/ACT NA SUSP: 2.0000 | Freq: Every day | NASAL | 3 refills | Status: AC | PRN

## 2024-01-15 NOTE — Assessment & Plan Note (Signed)
 Pt has upcoming cruise Prescription scopalamine patch for prn use  Reviewed side effects like sedation and dry mouth

## 2024-01-15 NOTE — Assessment & Plan Note (Signed)
 Right breast  Due for diag mm and us  in dec Order given for [facility]

## 2024-01-15 NOTE — Assessment & Plan Note (Signed)
 Pt needs to schedule her colonoscopy Has the prep Encouraged her to reach out and schedule it

## 2024-01-15 NOTE — Assessment & Plan Note (Signed)
 Last vitamin D  Lab Results  Component Value Date   VD25OH 27.2 (L) 12/06/2023   On high dose weekly D from weight clinic but just started it

## 2024-01-15 NOTE — Assessment & Plan Note (Signed)
 Discussed how this problem influences overall health and the risks it imposes  Reviewed plan for weight loss with lower calorie diet (via better food choices (lower glycemic and portion control) along with exercise building up to or more than 30 minutes 5 days per week including some aerobic activity and strength training    Going to healthy weight center Following progress

## 2024-01-15 NOTE — Assessment & Plan Note (Signed)
 bp in fair control at this time  BP Readings from Last 1 Encounters:  01/15/24 124/86   No changes needed Most recent labs reviewed  Disc lifstyle change with low sodium diet and exercise  Continues amlodipine  5 mg daily

## 2024-01-15 NOTE — Patient Instructions (Addendum)
 Reach out to the GI/colonoscopy folks to get that scheduled If you need help let us  know   I put orders in for imaging  You can call tomorrow to schedule    Keep up the good work with diet and exercise    Flu shot today

## 2024-01-15 NOTE — Assessment & Plan Note (Signed)
 Reviewed health habits including diet and exercise and skin cancer prevention Reviewed appropriate screening tests for age  Also reviewed health mt list, fam hx and immunization status , as well as social and family history   See HPI Labs reviewed and ordered Health Maintenance  Topic Date Due   HIV Screening  Never done   Hepatitis C Screening  Never done   Hepatitis B Vaccine (1 of 3 - 19+ 3-dose series) Never done   HPV Vaccine (1 - Risk 3-dose SCDM series) Never done   Breast Cancer Screening  02/12/2023   COVID-19 Vaccine (3 - Pfizer risk series) 01/30/2026*   Pap with HPV screening  01/09/2025   DTaP/Tdap/Td vaccine (4 - Td or Tdap) 05/13/2030   Flu Shot  Completed   Pneumococcal Vaccine  Aged Out   Meningitis B Vaccine  Aged Out  *Topic was postponed. The date shown is not the original due date.    Flu shot today Encouraged follow up for pap (did not want to do today) Encouraged to call and schedule her colonoscopy  Discussed fall prevention, supplements and exercise for bone density  PHQ 3-thinks mood is good overall

## 2024-01-15 NOTE — Progress Notes (Signed)
 Subjective:    Patient ID: Jillian Fleming, female    DOB: December 15, 1980, 43 y.o.   MRN: [id number]  HPI  Here for health maintenance exam and to review chronic medical problems   Wt Readings from Last 3 Encounters:  01/15/24 182 lb 6 oz (82.7 kg)  01/10/24 180 lb (81.6 kg)  12/20/23 182 lb (82.6 kg)   31.30 kg/m  Vitals:   01/15/24 0941  BP: 124/86  Pulse: 72  Temp: 98.3 F (36.8 C)  SpO2: 100%    Immunization History  Administered Date(s) Administered   H1N1 01/24/2008   Influenza, Seasonal, Injecte, Preservative Fre 01/15/2024   Influenza,inj,Quad PF,6+ Mos 12/24/2013, 11/25/2014, 04/26/2016, 01/10/2020   PFIZER(Purple Top)SARS-COV-2 Vaccination 05/22/2019, 06/12/2019   Td 03/14/2008   Tdap 11/04/2014, 05/12/2020    Health Maintenance Due  Topic Date Due   HIV Screening  Never done   Hepatitis C Screening  Never done   Hepatitis B Vaccines 19-59 Average Risk (1 of 3 - 19+ 3-dose series) Never done   HPV VACCINES (1 - Risk 3-dose SCDM series) Never done   Mammogram  02/12/2023   Flu shot   Mammogram 02/2023 from [facility] Has diagnostic mammograms with US  for very dense breasts with cysts (right) Self breast exam-no more lumpy than usual   Gyn health Pap 2021 , has not been in a while   Has not had abn paps Will follow up for pap    Colon cancer screening  Sibling with colon cancer in 50s  Had a colonoscopy scheduled but no one reached out to set is up / has the prep   Bone health   Falls -none Fractures-none  Supplements -high dose D3 weekly from weight center - just a month ago  Last vitamin D  Lab Results  Component Value Date   VD25OH 27.2 (L) 12/06/2023    Exercise  Joined [employer]  Loves it  At least 3 d per week  (strength training)  Walking at home    Mood    01/15/2024   10:16 AM 12/06/2023    9:11 AM 01/13/2023    9:58 AM 04/27/2022    8:53 AM 04/13/2022    9:47 AM  Depression screen PHQ 2/9  Decreased Interest 0 1  0 0 0  Down, Depressed, Hopeless 0 0 0 0 0  PHQ - 2 Score 0 1 0 0 0  Altered sleeping 1 0 0 0 1  Tired, decreased energy 1 2 1 1 1   Change in appetite 0 1 1 1 1   Feeling bad or failure about yourself  0 0 0 0 0  Trouble concentrating 1 1 1 1  0  Moving slowly or fidgety/restless 0 0 0 0 0  Suicidal thoughts 0 0 0 0 0  PHQ-9 Score 3 5 3 3 3   Difficult doing work/chores Not difficult at all Somewhat difficult Somewhat difficult Not difficult at all Not difficult at all   Doing good mood wise Busy schedule   [facility]  Has abn slow metabolism (surprisingly in light of good muscle mass)  Taking metformin xr 500 mg (this makes her more regular with bms)      HTN bp is stable today  No cp or palpitations or headaches or edema  No side effects to medicines  BP Readings from Last 3 Encounters:  01/15/24 124/86  01/10/24 130/86  12/20/23 133/88     Lab Results  Component Value Date  NA 138 12/06/2023   K 3.9 12/06/2023   CO2 21 12/06/2023   GLUCOSE 79 12/06/2023   BUN 11 12/06/2023   CREATININE 0.96 12/06/2023   CALCIUM 9.6 12/06/2023   GFR 75.32 01/06/2023   EGFR 75 12/06/2023   Amlodipine  5 mg daily    Cholesterol Lab Results  Component Value Date   CHOL 133 12/06/2023   CHOL 125 01/06/2023   CHOL 120 01/04/2022   Lab Results  Component Value Date   HDL 42 12/06/2023   HDL 44.80 01/06/2023   HDL 42.70 01/04/2022   Lab Results  Component Value Date   LDLCALC 81 12/06/2023   LDLCALC 73 01/06/2023   LDLCALC 69 01/04/2022   Lab Results  Component Value Date   TRIG 43 12/06/2023   TRIG 38.0 01/06/2023   TRIG 42.0 01/04/2022   Lab Results  Component Value Date   CHOLHDL 3 01/06/2023   CHOLHDL 3 01/04/2022   CHOLHDL 3 10/14/2020   No results found for: LDLDIRECT Is exercising more   Really watching what she eats    Lab Results  Component Value Date   ALT 9 12/06/2023   AST 15 12/06/2023   ALKPHOS 63  12/06/2023   BILITOT 0.5 12/06/2023   Lab Results  Component Value Date   TSH 1.020 12/06/2023   Lab Results  Component Value Date   WBC 5.3 12/06/2023   HGB 13.0 12/06/2023   HCT 41.0 12/06/2023   MCV 88 12/06/2023   PLT 284 12/06/2023      Patient Active Problem List   Diagnosis Date Noted   Multiple cysts of breast 01/15/2024   Motion sickness 01/15/2024   Vitamin D  deficiency 12/20/2023   Class 1 obesity with body mass index (BMI) of 31.0 to 31.9 in adult 12/06/2023   Abnormal metabolism 12/06/2023   Inadequate sleep hygiene 12/06/2023   Abnormal mammogram 02/01/2023   Family history of colon cancer 01/13/2023   Menstrual headache 04/13/2022   Onychomycosis of great toe 01/11/2022   Obesity (BMI 30-39.9) 08/09/2019   Allergic rhinitis 04/26/2016   Essential hypertension 04/10/2015   Adjustment reaction with anxiety and depression 01/28/2014   Food allergy 02/27/2013   Routine general medical examination at a health care facility 02/19/2013   Anti-phospholipid antibody syndrome 04/07/2009   Past Medical History:  Diagnosis Date   High blood pressure    History of antiphospholipid syndrome    is on asa and has to have lovenox for pregnancy   History of blood transfusion 03/15/2003   after miscarriage   History of miscarriage    Past Surgical History:  Procedure Laterality Date   BIOPSY BREAST  03/15/1999   DILATION AND CURETTAGE OF UTERUS     HERNIA REPAIR     HYSTEROSCOPY     surgery to remove scar tissue   Social History   Tobacco Use   Smoking status: Never   Smokeless tobacco: Never  Vaping Use   Vaping status: Never Used  Substance Use Topics   Alcohol use: No    Alcohol/week: 0.0 standard drinks of alcohol   Drug use: No   Family History  Problem Relation Age of Onset   Hypertension Mother    Diabetes Mother    Cancer Mother    Colon cancer Father    Prostate cancer Father    Colon cancer Brother    Allergies  Allergen Reactions    Other Hives    Jillian ALE   Pollen Extract Itching  Alcohol     Eyelids swell    Chlorthalidone      Caused bp to go up instead of down?    Current Outpatient Medications on File Prior to Visit  Medication Sig Dispense Refill   amLODipine  (NORVASC ) 5 MG tablet TAKE 1 TABLET (5 MG TOTAL) BY MOUTH DAILY. 90 tablet 3   EPINEPHrine  0.3 mg/0.3 mL IJ SOAJ injection Inject 0.3 mg into the muscle as needed for anaphylaxis. (Patient not taking: Reported on 01/10/2024) 1 each 0   metFORMIN (GLUCOPHAGE-XR) 500 MG 24 hr tablet Take 1 tablet (500 mg total) by mouth daily with breakfast. 90 tablet 0   Vitamin D , Ergocalciferol , (DRISDOL) 1.25 MG (50000 UNIT) CAPS capsule Take 1 capsule (50,000 Units total) by mouth every 7 (seven) days. 16 capsule 0   No current facility-administered medications on file prior to visit.    Review of Systems  Constitutional:  Positive for fatigue. Negative for activity change, appetite change, fever and unexpected weight change.  HENT:  Negative for congestion, ear pain, rhinorrhea, sinus pressure and sore throat.   Eyes:  Negative for pain, redness and visual disturbance.  Respiratory:  Negative for cough, shortness of breath and wheezing.   Cardiovascular:  Negative for chest pain and palpitations.  Gastrointestinal:  Negative for abdominal pain, blood in stool, constipation and diarrhea.  Endocrine: Negative for polydipsia and polyuria.  Genitourinary:  Negative for dysuria, frequency and urgency.  Musculoskeletal:  Negative for arthralgias, back pain and myalgias.  Skin:  Negative for pallor and rash.  Allergic/Immunologic: Negative for environmental allergies.  Neurological:  Negative for dizziness, syncope and headaches.  Hematological:  Negative for adenopathy. Does not bruise/bleed easily.  Psychiatric/Behavioral:  Negative for decreased concentration and dysphoric mood. The patient is not nervous/anxious.        Objective:   Physical  Exam Constitutional:      General: She is not in acute distress.    Appearance: Normal appearance. She is well-developed. She is obese. She is not ill-appearing or diaphoretic.  HENT:     Head: Normocephalic and atraumatic.     Right Ear: Tympanic membrane, ear canal and external ear normal.     Left Ear: Tympanic membrane, ear canal and external ear normal.     Nose: Nose normal. No congestion.     Mouth/Throat:     Mouth: Mucous membranes are moist.     Pharynx: Oropharynx is clear. No posterior oropharyngeal erythema.  Eyes:     General: No scleral icterus.    Extraocular Movements: Extraocular movements intact.     Conjunctiva/sclera: Conjunctivae normal.     Pupils: Pupils are equal, round, and reactive to light.  Neck:     Thyroid: No thyromegaly.     Vascular: No carotid bruit or JVD.  Cardiovascular:     Rate and Rhythm: Normal rate and regular rhythm.     Pulses: Normal pulses.     Heart sounds: Normal heart sounds.     No gallop.  Pulmonary:     Effort: Pulmonary effort is normal. No respiratory distress.     Breath sounds: Normal breath sounds. No wheezing.     Comments: Good air exch Chest:     Chest wall: No tenderness.  Abdominal:     General: Bowel sounds are normal. There is no distension or abdominal bruit.     Palpations: Abdomen is soft. There is no mass.     Tenderness: There is no abdominal tenderness.     Hernia:  No hernia is present.  Genitourinary:    Comments: Breast exam: No mass, nodules, thickening, tenderness, bulging, retraction, inflamation, nipple discharge or skin changes noted.  No axillary or clavicular LA.     Musculoskeletal:        General: No tenderness. Normal range of motion.     Cervical back: Normal range of motion and neck supple. No rigidity. No muscular tenderness.     Right lower leg: No edema.     Left lower leg: No edema.     Comments: No kyphosis   Lymphadenopathy:     Cervical: No cervical adenopathy.  Skin:    General:  Skin is warm and dry.     Coloration: Skin is not pale.     Findings: No erythema or rash.  Neurological:     Mental Status: She is alert. Mental status is at baseline.     Cranial Nerves: No cranial nerve deficit.     Motor: No abnormal muscle tone.     Coordination: Coordination normal.     Gait: Gait normal.     Deep Tendon Reflexes: Reflexes are normal and symmetric. Reflexes normal.  Psychiatric:        Mood and Affect: Mood normal.        Cognition and Memory: Cognition and memory normal.           Assessment & Plan:   Problem List Items Addressed This Visit       Cardiovascular and Mediastinum   Essential hypertension   bp in fair control at this time  BP Readings from Last 1 Encounters:  01/15/24 124/86   No changes needed Most recent labs reviewed  Disc lifstyle change with low sodium diet and exercise  Continues amlodipine  5 mg daily        Other   Vitamin D  deficiency   Last vitamin D  Lab Results  Component Value Date   VD25OH 27.2 (L) 12/06/2023   On high dose weekly D from weight clinic but just started it       Routine general medical examination at a health care facility - Primary   Reviewed health habits including diet and exercise and skin cancer prevention Reviewed appropriate screening tests for age  Also reviewed health mt list, fam hx and immunization status , as well as social and family history   See HPI Labs reviewed and ordered Health Maintenance  Topic Date Due   HIV Screening  Never done   Hepatitis C Screening  Never done   Hepatitis B Vaccine (1 of 3 - 19+ 3-dose series) Never done   HPV Vaccine (1 - Risk 3-dose SCDM series) Never done   Breast Cancer Screening  02/12/2023   COVID-19 Vaccine (3 - Pfizer risk series) 01/30/2026*   Pap with HPV screening  01/09/2025   DTaP/Tdap/Td vaccine (4 - Td or Tdap) 05/13/2030   Flu Shot  Completed   Pneumococcal Vaccine  Aged Out   Meningitis B Vaccine  Aged Out  *Topic was  postponed. The date shown is not the original due date.    Flu shot today Encouraged follow up for pap (did not want to do today) Encouraged to call and schedule her colonoscopy  Discussed fall prevention, supplements and exercise for bone density  PHQ 3-thinks mood is good overall      Obesity (BMI 30-39.9)   Discussed how this problem influences overall health and the risks it imposes  Reviewed plan for weight loss with lower calorie  diet (via better food choices (lower glycemic and portion control) along with exercise building up to or more than 30 minutes 5 days per week including some aerobic activity and strength training    Going to healthy weight center Following progress       Multiple cysts of breast   Right breast  Due for diag mm and us  in dec Order given for [facility]      Relevant Orders   MM Digital Diagnostic Bilat   US  BREAST COMPLETE UNI RIGHT INC AXILLA   Motion sickness   Pt has upcoming cruise Prescription scopalamine patch for prn use  Reviewed side effects like sedation and dry mouth      Family history of colon cancer   Pt needs to schedule her colonoscopy Has the prep Encouraged her to reach out and schedule it       Other Visit Diagnoses       Need for influenza vaccination       Relevant Orders   Flu vaccine trivalent PF, 6mos and older(Flulaval,Afluria,Fluarix,Fluzone) (Completed)

## 2024-02-15 ENCOUNTER — Encounter (INDEPENDENT_AMBULATORY_CARE_PROVIDER_SITE_OTHER): Payer: Self-pay | Admitting: Internal Medicine

## 2024-02-15 ENCOUNTER — Ambulatory Visit (INDEPENDENT_AMBULATORY_CARE_PROVIDER_SITE_OTHER): Admitting: Internal Medicine

## 2024-02-15 VITALS — BP 136/87 | HR 76 | Temp 98.6°F | Ht 64.0 in | Wt 176.0 lb

## 2024-02-15 DIAGNOSIS — R948 Abnormal results of function studies of other organs and systems: Secondary | ICD-10-CM | POA: Diagnosis not present

## 2024-02-15 DIAGNOSIS — Z6831 Body mass index (BMI) 31.0-31.9, adult: Secondary | ICD-10-CM

## 2024-02-15 DIAGNOSIS — E559 Vitamin D deficiency, unspecified: Secondary | ICD-10-CM | POA: Diagnosis not present

## 2024-02-15 DIAGNOSIS — I10 Essential (primary) hypertension: Secondary | ICD-10-CM | POA: Diagnosis not present

## 2024-02-15 DIAGNOSIS — E66811 Obesity, class 1: Secondary | ICD-10-CM | POA: Diagnosis not present

## 2024-02-15 MED ORDER — VITAMIN D (ERGOCALCIFEROL) 1.25 MG (50000 UNIT) PO CAPS: 50000.0000 [IU] | ORAL_CAPSULE | ORAL | 0 refills | Status: AC

## 2024-02-15 NOTE — Assessment & Plan Note (Signed)
 Most recent vitamin D  levels  Lab Results  Component Value Date   VD25OH 27.2 (L) 12/06/2023     Deficiency state associated with adiposity and may result in leptin resistance, weight gain and fatigue.  Plan: Continue high-dose vitamin D  supplementation for total 4 months and then transition to over-the-counter

## 2024-02-15 NOTE — Assessment & Plan Note (Signed)
 Overweight with slow metabolic rate She has lost four pounds since September, despite a slow metabolic rate of 8599 calories per day, below the expected 1600. Her body fat percentage has decreased from 35% to 33%, within the desirable range of 23-34%. Her BMI is not a primary concern due to her muscle mass. Weight loss is gradual due to her slow metabolism, but increased physical activity has been beneficial. - Continue metformin  as prescribed. - Maintain current exercise regimen of 4 days a week, 30-60 minutes per session, combining cardio and strengthening exercises. - Continue dietary management with a focus on 1000 calories per day, adjusting as needed based on metabolic rate. - Encouraged meal preparation and healthy snacking to avoid decision fatigue. - Consider meal replacements or prepackaged meals as convenient options. - Aim for a target weight of 166 pounds, focusing on maintaining weight once achieved.

## 2024-02-15 NOTE — Assessment & Plan Note (Signed)
 Current blood pressure is 136/87 mmHg, above the target of under 130/80 mmHg. Weight loss and increased physical activity are expected to aid in blood pressure management. - Continue weight loss and physical activity to aid in blood pressure management.

## 2024-02-15 NOTE — Progress Notes (Signed)
 Office: [phone]  /  Fax: [phone]  Weight Summary and Body Composition Analysis (BIA)  Vitals Temp: 98.6 F (37 C) BP: 136/87 Pulse Rate: 76 SpO2: 100 %   Anthropometric Measurements Height: 5' 4 (1.626 m) Weight: 176 lb (79.8 kg) BMI (Calculated): 30.2 Weight at Last Visit: 180 lb Weight Lost Since Last Visit: 4 lb Weight Gained Since Last Visit: 0 lb Starting Weight: 182 lb Total Weight Loss (lbs): 6 lb (2.722 kg) Peak Weight: 185 lb   Body Composition  Body Fat %: 33.8 % Fat Mass (lbs): 59.6 lbs Muscle Mass (lbs): 110.8 lbs Total Body Water (lbs): 73.6 lbs    RMR: 1382  Today's Visit #: 4  Starting Date: 12/06/23   Subjective   Chief Complaint: Obesity  Interval History Discussed the use of AI scribe software for clinical note transcription with the patient, who gave verbal consent to proceed.  History of Present Illness Jillian Fleming is a 43 year old female who presents for weight management follow-up.  She has lost four pounds following a cruise to the [place], attributing this to unappealing food options and maintaining her calorie intake. During Thanksgiving, she consumed less due to the food lacking seasoning and not being a drinker, which helped her avoid additional calorie intake.  She is currently taking metformin , one tablet daily, with no side effects. Her clothes are fitting looser, and she reports changes in her waist size and overall body shape. Her physical activity has increased since September, with her exercising four days a week for 30 to 60 minutes, combining cardio and strength training.  Her dietary regimen includes aiming for about 1,000 calories per day, with a basal metabolic rate of 8,599 calories. She manages her diet by preparing snacks and meals in advance, especially during busy days with her children's activities, to avoid fast food. She prefers packing her own meals during trips to avoid fast food, which she  dislikes.  Her body fat percentage was previously measured at 45% and is now 33%. She reports that she has a decent amount of muscle and is considering a target weight of 166 pounds.  She does not require a refill for metformin  as she has a 90-day supply but needs a refill for her vitamin supplement, which was not provided in full. She maintains her protein intake, aiming for about 30 grams per meal to help with hunger reduction and metabolic rate boost.     Challenges affecting patient progress: none.    Pharmacotherapy for weight management: She is currently taking Metformin  (off label use for weight management and / or insulin  resistance and / or diabetes prevention) with adequate clinical response  and without side effects..   Assessment and Plan   Treatment Plan For Obesity:  Recommended Dietary Goals  Jillian Fleming is currently in the action stage of change. As such, her goal is to continue weight management plan. She has agreed to: continue current plan  Behavioral Health and Counseling  We discussed the following behavioral modification strategies today: continue to work on maintaining a reduced calorie state, getting the recommended amount of protein, incorporating whole foods, making healthy choices, staying well hydrated and practicing mindfulness when eating. and increase protein intake, fibrous foods (25 grams per day for women, 30 grams for men) and water to improve satiety and decrease hunger signals. .  Additional education and resources provided today: None  Recommended Physical Activity Goals  Jillian Fleming has been advised to work up to 150 minutes of moderate  intensity aerobic activity a week and strengthening exercises 2-3 times per week for cardiovascular health, weight loss maintenance and preservation of muscle mass.  She has agreed to :  Think about enjoyable ways to increase daily physical activity and overcoming barriers to exercise, Increase physical activity in their  day and reduce sedentary time (increase NEAT)., Increase volume of physical activity to a goal of 240 minutes a week, and Combine aerobic and strengthening exercises for efficiency and improved cardiometabolic health.  Medical Interventions and Pharmacotherapy  We discussed various medication options to help Jillian Fleming with her weight loss efforts and we both agreed to : Adequate clinical response to anti-obesity medication, continue current regimen  Associated Conditions Impacted by Obesity Treatment  Assessment & Plan Vitamin D  deficiency Most recent vitamin D  levels  Lab Results  Component Value Date   VD25OH 27.2 (L) 12/06/2023     Deficiency state associated with adiposity and may result in leptin resistance, weight gain and fatigue.  Plan: Continue high-dose vitamin D  supplementation for total 4 months and then transition to over-the-counter Essential hypertension Current blood pressure is 136/87 mmHg, above the target of under 130/80 mmHg. Weight loss and increased physical activity are expected to aid in blood pressure management. - Continue weight loss and physical activity to aid in blood pressure management. Abnormal metabolism Class 1 obesity with body mass index (BMI) of 31.0 to 31.9 in adult, unspecified obesity type, unspecified whether serious comorbidity present Overweight with slow metabolic rate She has lost four pounds since September, despite a slow metabolic rate of 8599 calories per day, below the expected 1600. Her body fat percentage has decreased from 35% to 33%, within the desirable range of 23-34%. Her BMI is not a primary concern due to her muscle mass. Weight loss is gradual due to her slow metabolism, but increased physical activity has been beneficial. - Continue metformin  as prescribed. - Maintain current exercise regimen of 4 days a week, 30-60 minutes per session, combining cardio and strengthening exercises. - Continue dietary management with a focus on  1000 calories per day, adjusting as needed based on metabolic rate. - Encouraged meal preparation and healthy snacking to avoid decision fatigue. - Consider meal replacements or prepackaged meals as convenient options. - Aim for a target weight of 166 pounds, focusing on maintaining weight once achieved.        Objective   Physical Exam:  Blood pressure 136/87, pulse 76, temperature 98.6 F (37 C), height 5' 4 (1.626 m), weight 176 lb (79.8 kg), last menstrual period 01/30/2024, SpO2 100%. Body mass index is 30.21 kg/m.  General: She is overweight, cooperative, alert, well developed, and in no acute distress. PSYCH: Has normal mood, affect and thought process.   HEENT: EOMI, sclerae are anicteric. Lungs: Normal breathing effort, no conversational dyspnea. Extremities: No edema.  Neurologic: No gross sensory or motor deficits. No tremors or fasciculations noted.    Diagnostic Data Reviewed:  BMET    Component Value Date/Time   NA 138 12/06/2023 1034   K 3.9 12/06/2023 1034   CL 101 12/06/2023 1034   CO2 21 12/06/2023 1034   GLUCOSE 79 12/06/2023 1034   GLUCOSE 93 01/06/2023 0908   BUN 11 12/06/2023 1034   CREATININE 0.96 12/06/2023 1034   CALCIUM 9.6 12/06/2023 1034   Lab Results  Component Value Date   HGBA1C 5.5 12/06/2023   Lab Results  Component Value Date   INSULIN  6.9 12/06/2023   Lab Results  Component Value Date  TSH 1.020 12/06/2023   CBC    Component Value Date/Time   WBC 5.3 12/06/2023 1034   WBC 5.9 01/06/2023 0908   RBC 4.64 12/06/2023 1034   RBC 4.47 01/06/2023 0908   HGB 13.0 12/06/2023 1034   HCT 41.0 12/06/2023 1034   PLT 284 12/06/2023 1034   MCV 88 12/06/2023 1034   MCH 28.0 12/06/2023 1034   MCH 29.6 01/18/2014 1502   MCHC 31.7 12/06/2023 1034   MCHC 31.9 01/06/2023 0908   RDW 12.9 12/06/2023 1034   Iron Studies No results found for: IRON, TIBC, FERRITIN, IRONPCTSAT Lipid Panel     Component Value Date/Time   CHOL  133 12/06/2023 1034   TRIG 43 12/06/2023 1034   HDL 42 12/06/2023 1034   CHOLHDL 3 01/06/2023 0908   VLDL 7.6 01/06/2023 0908   LDLCALC 81 12/06/2023 1034   Hepatic Function Panel     Component Value Date/Time   PROT 7.5 12/06/2023 1034   ALBUMIN 4.8 12/06/2023 1034   AST 15 12/06/2023 1034   ALT 9 12/06/2023 1034   ALKPHOS 63 12/06/2023 1034   BILITOT 0.5 12/06/2023 1034      Component Value Date/Time   TSH 1.020 12/06/2023 1034   Nutritional Lab Results  Component Value Date   VD25OH 27.2 (L) 12/06/2023    Medications: Outpatient Encounter Medications as of 02/15/2024  Medication Sig   amLODipine  (NORVASC ) 5 MG tablet TAKE 1 TABLET (5 MG TOTAL) BY MOUTH DAILY.   EPINEPHrine  0.3 mg/0.3 mL IJ SOAJ injection Inject 0.3 mg into the muscle as needed for anaphylaxis. (Patient not taking: Reported on 01/10/2024)   fluticasone  (FLONASE ) 50 MCG/ACT nasal spray Place 2 sprays into both nostrils daily as needed for allergies or rhinitis.   metFORMIN  (GLUCOPHAGE -XR) 500 MG 24 hr tablet Take 1 tablet (500 mg total) by mouth daily with breakfast.   scopolamine  (TRANSDERM-SCOP) 1 MG/3DAYS Place 1 patch (1 mg total) onto the skin every 3 (three) days.   Vitamin D , Ergocalciferol , (DRISDOL ) 1.25 MG (50000 UNIT) CAPS capsule Take 1 capsule (50,000 Units total) by mouth every 7 (seven) days.   [DISCONTINUED] Vitamin D , Ergocalciferol , (DRISDOL ) 1.25 MG (50000 UNIT) CAPS capsule Take 1 capsule (50,000 Units total) by mouth every 7 (seven) days.   No facility-administered encounter medications on file as of 02/15/2024.     Follow-Up   Return in about 4 weeks (around 03/14/2024) for For Weight Mangement with Dr. Francyne.SABRA She was informed of the importance of frequent follow up visits to maximize her success with intensive lifestyle modifications for her multiple health conditions.  Attestation Statement   Reviewed by clinician on day of visit: allergies, medications, problem list, medical  history, surgical history, family history, social history, and previous encounter notes.     Lucas Francyne, MD

## 2024-03-11 ENCOUNTER — Other Ambulatory Visit (INDEPENDENT_AMBULATORY_CARE_PROVIDER_SITE_OTHER): Payer: Self-pay | Admitting: Internal Medicine

## 2024-03-11 DIAGNOSIS — E559 Vitamin D deficiency, unspecified: Secondary | ICD-10-CM

## 2024-03-17 ENCOUNTER — Other Ambulatory Visit (INDEPENDENT_AMBULATORY_CARE_PROVIDER_SITE_OTHER): Payer: Self-pay | Admitting: Internal Medicine

## 2024-03-17 DIAGNOSIS — Z6831 Body mass index (BMI) 31.0-31.9, adult: Secondary | ICD-10-CM

## 2024-03-18 LAB — HM MAMMOGRAPHY

## 2024-03-19 ENCOUNTER — Encounter (INDEPENDENT_AMBULATORY_CARE_PROVIDER_SITE_OTHER): Payer: Self-pay | Admitting: Internal Medicine

## 2024-03-19 ENCOUNTER — Encounter: Payer: Self-pay | Admitting: Family Medicine

## 2024-03-19 ENCOUNTER — Ambulatory Visit (INDEPENDENT_AMBULATORY_CARE_PROVIDER_SITE_OTHER): Admitting: Internal Medicine

## 2024-03-19 VITALS — BP 138/89 | HR 80 | Temp 97.6°F | Ht 64.0 in | Wt 173.0 lb

## 2024-03-19 DIAGNOSIS — R948 Abnormal results of function studies of other organs and systems: Secondary | ICD-10-CM

## 2024-03-19 DIAGNOSIS — I10 Essential (primary) hypertension: Secondary | ICD-10-CM

## 2024-03-19 DIAGNOSIS — E66811 Obesity, class 1: Secondary | ICD-10-CM

## 2024-03-19 DIAGNOSIS — Z6831 Body mass index (BMI) 31.0-31.9, adult: Secondary | ICD-10-CM | POA: Diagnosis not present

## 2024-03-19 MED ORDER — METFORMIN HCL ER 500 MG PO TB24: 500.0000 mg | ORAL_TABLET | Freq: Every day | ORAL | 0 refills | Status: AC

## 2024-03-19 NOTE — Assessment & Plan Note (Signed)
 She has lost 10 pounds since September, reducing her BMI to 29 and body fat percentage to 33%. She follows a 1000 calorie nutrition plan, is low carb 90% of the time, and exercises three days a week with cardio and strength training. She self-monitors her nutrition using a free fitness app and has improved her body composition. Metformin  aids in appetite control and regular bowel movements. She is not interested in stronger weight loss medications like phentermine or GLP-1 agonists due to potential weight regain after discontinuation. Emphasis is placed on maintaining physical activity due to her slow metabolic rate, which is likely genetic. - Continue 1000 calorie nutrition plan and low carb diet. - Continue exercising three days a week with cardio and strength training. - Continue self-monitoring nutrition using fitness app. - Continue metformin  once daily; will consider increasing to twice daily if weight loss plateaus. - Encouraged progressive exercise to maintain intensity and challenge. - Scheduled monthly follow-up visits for accountability.

## 2024-03-19 NOTE — Progress Notes (Signed)
 "  Office: [phone]  /  Fax: [phone]  Weight Summary and Body Composition Analysis (BIA)  Vitals Temp: 97.6 F (36.4 C) BP: 138/89 Pulse Rate: 80 SpO2: 100 %   Anthropometric Measurements Height: 5' 4 (1.626 m) Weight: 173 lb (78.5 kg) BMI (Calculated): 29.68 Weight at Last Visit: 176 lb Weight Lost Since Last Visit: 3 lb Weight Gained Since Last Visit: 0 lb Starting Weight: 182 lb Total Weight Loss (lbs): 9 lb (4.082 kg) Peak Weight: 185 lb   Body Composition  Body Fat %: 33.9 % Fat Mass (lbs): 58.8 lbs Muscle Mass (lbs): 108.6 lbs Total Body Water (lbs): 73.2 lbs Visceral Fat Rating : 7    RMR: 1382  Today's Visit #: 5  Starting Date: 12/06/23   Subjective   Chief Complaint: Obesity  Interval History  Discussed the use of AI scribe software for clinical note transcription with the patient, who gave verbal consent to proceed.  History of Present Illness Jillian Fleming is a 44 year old female who presents for medical weight management.  Since September, she has been actively managing her weight, achieving a weight loss of 10 pounds, approximately 5% of her body weight. She follows a 1000-calorie low-carb diet, exercises three days a week, and uses a fitness app for self-monitoring. She has noticed changes in her clothing fit, moving from a size ten to a comfortable size eight.  Over the holidays, she lost three pounds while adhering to a low-carb diet 90% of the time and exercising three days a week for 45 to 50 minutes, including cardio and strength training. She tracks her nutrition using the free version of [employer]itness[employer], focusing on a balanced intake of protein and fruits. She does not consume sodas regularly, opting for Pepsi Zero as an occasional treat, and emphasizes the importance of taste in her diet, stocking her home with healthy snacks like nuts and fruits.  She experienced a knee injury of unknown origin, characterized by pain upon  waking, which affects her ability to put pressure on it, especially when going up steps or running. There is no associated swelling or redness. The pain is intermittent, and she finds that using the elliptical is beneficial as it does not put much pressure on the knee.  She is currently taking metformin , which helps with appetite control and regularity. Her increased intake of fruits, vegetables, and water may also contribute to improved bowel regularity. Her appetite is well-controlled with metformin , and she does not experience significant hunger.     Challenges affecting patient progress: None.    Pharmacotherapy for weight management: She is currently taking Metformin  (off label use for weight management and / or insulin  resistance and / or diabetes prevention) with adequate clinical response  and without side effects..   Assessment and Plan   Treatment Plan For Obesity:  Recommended Dietary Goals  Jillian Fleming is currently in the action stage of change. As such, her goal is to continue weight management plan. She has agreed to: follow the Category 1 plan - 1000 kcal per day  Behavioral Health and Counseling  We discussed the following behavioral modification strategies today: continue to work on maintaining a reduced calorie state, getting the recommended amount of protein, incorporating whole foods, making healthy choices, staying well hydrated and practicing mindfulness when eating. and increase protein intake, fibrous foods (25 grams per day for women, 30 grams for men) and water to improve satiety and decrease hunger signals. .  Additional education and resources provided today:  None  Recommended Physical Activity Goals  Jillian Fleming has been advised to work up to 150 minutes of moderate intensity aerobic activity a week and strengthening exercises 2-3 times per week for cardiovascular health, weight loss maintenance and preservation of muscle mass.  She has agreed to :  Think about  enjoyable ways to increase daily physical activity and overcoming barriers to exercise, Increase physical activity in their day and reduce sedentary time (increase NEAT)., Increase volume of physical activity to a goal of 240 minutes a week, and Combine aerobic and strengthening exercises for efficiency and improved cardiometabolic health.  Medical Interventions and Pharmacotherapy  We discussed various medication options to help Jillian Fleming with her weight loss efforts and we both agreed to : Adequate clinical response to anti-obesity medication, continue current anti-obesity regimen  Associated Conditions Impacted by Obesity Treatment  Assessment & Plan Essential hypertension Blood pressure slightly above goal.  On amlodipine  5 mg once a day.  Continue current weight management strategy Class 1 obesity with body mass index (BMI) of 31.0 to 31.9 in adult, unspecified obesity type, unspecified whether serious comorbidity present Abnormal metabolism She has lost 10 pounds since September, reducing her BMI to 29 and body fat percentage to 33%. She follows a 1000 calorie nutrition plan, is low carb 90% of the time, and exercises three days a week with cardio and strength training. She self-monitors her nutrition using a free fitness app and has improved her body composition. Metformin  aids in appetite control and regular bowel movements. She is not interested in stronger weight loss medications like phentermine or GLP-1 agonists due to potential weight regain after discontinuation. Emphasis is placed on maintaining physical activity due to her slow metabolic rate, which is likely genetic. - Continue 1000 calorie nutrition plan and low carb diet. - Continue exercising three days a week with cardio and strength training. - Continue self-monitoring nutrition using fitness app. - Continue metformin  once daily; will consider increasing to twice daily if weight loss plateaus. - Encouraged progressive exercise  to maintain intensity and challenge. - Scheduled monthly follow-up visits for accountability.      Objective   Physical Exam:  Blood pressure 138/89, pulse 80, temperature 97.6 F (36.4 C), height 5' 4 (1.626 m), weight 173 lb (78.5 kg), last menstrual period 01/29/2024, SpO2 100%. Body mass index is 29.7 kg/m.  General: She is overweight, cooperative, alert, well developed, and in no acute distress. PSYCH: Has normal mood, affect and thought process.   HEENT: EOMI, sclerae are anicteric. Lungs: Normal breathing effort, no conversational dyspnea. Extremities: No edema.  Neurologic: No gross sensory or motor deficits. No tremors or fasciculations noted.    Diagnostic Data Reviewed:  BMET    Component Value Date/Time   NA 138 12/06/2023 1034   K 3.9 12/06/2023 1034   CL 101 12/06/2023 1034   CO2 21 12/06/2023 1034   GLUCOSE 79 12/06/2023 1034   GLUCOSE 93 01/06/2023 0908   BUN 11 12/06/2023 1034   CREATININE 0.96 12/06/2023 1034   CALCIUM 9.6 12/06/2023 1034   Lab Results  Component Value Date   HGBA1C 5.5 12/06/2023   Lab Results  Component Value Date   INSULIN  6.9 12/06/2023   Lab Results  Component Value Date   TSH 1.020 12/06/2023   CBC    Component Value Date/Time   WBC 5.3 12/06/2023 1034   WBC 5.9 01/06/2023 0908   RBC 4.64 12/06/2023 1034   RBC 4.47 01/06/2023 0908   HGB 13.0 12/06/2023 1034  HCT 41.0 12/06/2023 1034   PLT 284 12/06/2023 1034   MCV 88 12/06/2023 1034   MCH 28.0 12/06/2023 1034   MCH 29.6 01/18/2014 1502   MCHC 31.7 12/06/2023 1034   MCHC 31.9 01/06/2023 0908   RDW 12.9 12/06/2023 1034   Iron Studies No results found for: IRON, TIBC, FERRITIN, IRONPCTSAT Lipid Panel     Component Value Date/Time   CHOL 133 12/06/2023 1034   TRIG 43 12/06/2023 1034   HDL 42 12/06/2023 1034   CHOLHDL 3 01/06/2023 0908   VLDL 7.6 01/06/2023 0908   LDLCALC 81 12/06/2023 1034   Hepatic Function Panel     Component Value  Date/Time   PROT 7.5 12/06/2023 1034   ALBUMIN 4.8 12/06/2023 1034   AST 15 12/06/2023 1034   ALT 9 12/06/2023 1034   ALKPHOS 63 12/06/2023 1034   BILITOT 0.5 12/06/2023 1034      Component Value Date/Time   TSH 1.020 12/06/2023 1034   Nutritional Lab Results  Component Value Date   VD25OH 27.2 (L) 12/06/2023    Medications: Outpatient Encounter Medications as of 03/19/2024  Medication Sig   amLODipine  (NORVASC ) 5 MG tablet TAKE 1 TABLET (5 MG TOTAL) BY MOUTH DAILY.   EPINEPHrine  0.3 mg/0.3 mL IJ SOAJ injection Inject 0.3 mg into the muscle as needed for anaphylaxis. (Patient not taking: Reported on 03/19/2024)   fluticasone  (FLONASE ) 50 MCG/ACT nasal spray Place 2 sprays into both nostrils daily as needed for allergies or rhinitis.   metFORMIN  (GLUCOPHAGE -XR) 500 MG 24 hr tablet Take 1 tablet (500 mg total) by mouth daily with breakfast.   scopolamine  (TRANSDERM-SCOP) 1 MG/3DAYS Place 1 patch (1 mg total) onto the skin every 3 (three) days.   Vitamin D , Ergocalciferol , (DRISDOL ) 1.25 MG (50000 UNIT) CAPS capsule Take 1 capsule (50,000 Units total) by mouth every 7 (seven) days.   [DISCONTINUED] metFORMIN  (GLUCOPHAGE -XR) 500 MG 24 hr tablet Take 1 tablet (500 mg total) by mouth daily with breakfast.   No facility-administered encounter medications on file as of 03/19/2024.     Follow-Up   Return in about 4 weeks (around 04/16/2024) for For Weight Mangement with Dr. Francyne.SABRA She was informed of the importance of frequent follow up visits to maximize her success with intensive lifestyle modifications for her multiple health conditions.  Attestation Statement   Reviewed by clinician on day of visit: allergies, medications, problem list, medical history, surgical history, family history, social history, and previous encounter notes.     Lucas Francyne, MD  "

## 2024-03-19 NOTE — Assessment & Plan Note (Signed)
 Blood pressure slightly above goal.  On amlodipine  5 mg once a day.  Continue current weight management strategy

## 2024-04-16 ENCOUNTER — Ambulatory Visit (INDEPENDENT_AMBULATORY_CARE_PROVIDER_SITE_OTHER): Admitting: Internal Medicine

## 2024-05-01 ENCOUNTER — Ambulatory Visit (INDEPENDENT_AMBULATORY_CARE_PROVIDER_SITE_OTHER): Admitting: Internal Medicine
